# Patient Record
Sex: Female | Born: 1985
Health system: Southern US, Community
[De-identification: ages and names within clinical notes are randomized; demographics above are authoritative.]

## PROBLEM LIST (undated history)

## (undated) DIAGNOSIS — M898X9 Other specified disorders of bone, unspecified site: Secondary | ICD-10-CM

---
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
## 2007-12-02 ENCOUNTER — Emergency Department (HOSPITAL_COMMUNITY): Admission: EM | Admit: 2007-12-02 | Discharge: 2007-12-02 | Payer: Self-pay | Admitting: Emergency Medicine

## 2011-07-16 ENCOUNTER — Ambulatory Visit (INDEPENDENT_AMBULATORY_CARE_PROVIDER_SITE_OTHER): Payer: Self-pay | Admitting: Family Medicine

## 2011-07-16 ENCOUNTER — Encounter: Payer: Self-pay | Admitting: Family Medicine

## 2011-07-16 VITALS — BP 114/72 | HR 62 | Temp 97.8°F | Ht 67.25 in | Wt 230.3 lb

## 2011-07-16 DIAGNOSIS — Z23 Encounter for immunization: Secondary | ICD-10-CM

## 2011-07-16 DIAGNOSIS — R591 Generalized enlarged lymph nodes: Secondary | ICD-10-CM

## 2011-07-16 DIAGNOSIS — R599 Enlarged lymph nodes, unspecified: Secondary | ICD-10-CM

## 2011-07-16 MED ORDER — AMOXICILLIN-POT CLAVULANATE 875-125 MG PO TABS: 1.0000 | ORAL_TABLET | Freq: Two times a day (BID) | ORAL | Status: AC

## 2011-07-16 NOTE — Progress Notes (Signed)
  Subjective:    Patient ID: Shari Small, female    DOB: Jan 12, 1986, 26 y.o.   MRN: [id number]  HPI Pt here c/o lump behind R ear that hurts but has now gotten smaller.   No fever, congestion.   Review of Systems     Objective:   Physical Exam  Constitutional: She appears well-developed and well-nourished. No distress.  HENT:  Head: Normocephalic.  Right Ear: External ear normal.  Left Ear: External ear normal.  Nose: Nose normal.  Mouth/Throat: Oropharyngeal exudate present.  Neck: Normal range of motion. Neck supple.       Very small lymph node R side --tender to touch  Neurological: She is alert.  Skin: She is not diaphoretic.  Psychiatric: She has a normal mood and affect. Her behavior is normal. Judgment and thought content normal.          Assessment & Plan:  Lymph adenopathy-----resolving                                Check cbcd                               Augmentin for 10 days                                F/u if no better ---also should be evaluated by dentist--? TMJ

## 2011-07-16 NOTE — Progress Notes (Signed)
Addended by: Arnette Norris on: 07/16/2011 04:12 PM   Modules accepted: Orders

## 2011-07-16 NOTE — Patient Instructions (Signed)
Lymphadenopathy Lymphadenopathy means "disease of the lymph glands." But the term is usually used to describe swollen or enlarged lymph glands, also called lymph nodes. These are the bean-shaped organs found in many locations including the neck, underarm, and groin. Lymph glands are part of the immune system, which fights infections in your body. Lymphadenopathy can occur in just one area of the body, such as the neck, or it can be generalized, with lymph node enlargement in several areas. The nodes found in the neck are the most common sites of lymphadenopathy. CAUSES  When your immune system responds to germs (such as viruses or bacteria ), infection-fighting cells and fluid build up. This causes the glands to grow in size. This is usually not something to worry about. Sometimes, the glands themselves can become infected and inflamed. This is called lymphadenitis. Enlarged lymph nodes can be caused by many diseases:  Bacterial disease, such as strep throat or a skin infection.   Viral disease, such as a common cold.   Other germs, such as lyme disease, tuberculosis, or sexually transmitted diseases.   Cancers, such as lymphoma (cancer of the lymphatic system) or leukemia (cancer of the white blood cells).   Inflammatory diseases such as lupus or rheumatoid arthritis.   Reactions to medications.  Many of the diseases above are rare, but important. This is why you should see your caregiver if you have lymphadenopathy. SYMPTOMS   Swollen, enlarged lumps in the neck, back of the head or other locations.   Tenderness.   Warmth or redness of the skin over the lymph nodes.   Fever.  DIAGNOSIS  Enlarged lymph nodes are often near the source of infection. They can help healthcare providers diagnose your illness. For instance:   Swollen lymph nodes around the jaw might be caused by an infection in the mouth.   Enlarged glands in the neck often signal a throat infection.   Lymph nodes that  are swollen in more than one area often indicate an illness caused by a virus.  Your caregiver most likely will know what is causing your lymphadenopathy after listening to your history and examining you. Blood tests, x-rays or other tests may be needed. If the cause of the enlarged lymph node cannot be found, and it does not go away by itself, then a biopsy may be needed. Your caregiver will discuss this with you. TREATMENT  Treatment for your enlarged lymph nodes will depend on the cause. Many times the nodes will shrink to normal size by themselves, with no treatment. Antibiotics or other medicines may be needed for infection. Only take over-the-counter or prescription medicines for pain, discomfort or fever as directed by your caregiver. HOME CARE INSTRUCTIONS  Swollen lymph glands usually return to normal when the underlying medical condition goes away. If they persist, contact your health-care provider. He/she might prescribe antibiotics or other treatments, depending on the diagnosis. Take any medications exactly as prescribed. Keep any follow-up appointments made to check on the condition of your enlarged nodes.  SEEK MEDICAL CARE IF:   Swelling lasts for more than two weeks.   You have symptoms such as weight loss, night sweats, fatigue or fever that does not go away.   The lymph nodes are hard, seem fixed to the skin or are growing rapidly.   Skin over the lymph nodes is red and inflamed. This could mean there is an infection.  SEEK IMMEDIATE MEDICAL CARE IF:   Fluid starts leaking from the area of the   enlarged lymph node.   You develop a fever of 102 F (38.9 C) or greater.   Severe pain develops (not necessarily at the site of a large lymph node).   You develop chest pain or shortness of breath.   You develop worsening abdominal pain.  MAKE SURE YOU:   Understand these instructions.   Will watch your condition.   Will get help right away if you are not doing well or get  worse.  Document Released: 03/13/2008 Document Revised: 02/14/2011 Document Reviewed: 03/13/2008 [employer] Patient Information 2012 [employer]. 

## 2011-07-17 LAB — CBC WITH DIFFERENTIAL/PLATELET
Basophils Absolute: 0 10*3/uL (ref 0.0–0.1)
Basophils Relative: 0.5 % (ref 0.0–3.0)
Eosinophils Absolute: 0.5 10*3/uL (ref 0.0–0.7)
Eosinophils Relative: 5 % (ref 0.0–5.0)
HCT: 46.2 % — ABNORMAL HIGH (ref 36.0–46.0)
Hemoglobin: 15.2 g/dL — ABNORMAL HIGH (ref 12.0–15.0)
Lymphocytes Relative: 41.9 % (ref 12.0–46.0)
Lymphs Abs: 4.1 10*3/uL — ABNORMAL HIGH (ref 0.7–4.0)
MCHC: 33 g/dL (ref 30.0–36.0)
MCV: 92.4 fl (ref 78.0–100.0)
Monocytes Absolute: 0.9 10*3/uL (ref 0.1–1.0)
Monocytes Relative: 9.3 % (ref 3.0–12.0)
Neutro Abs: 4.3 10*3/uL (ref 1.4–7.7)
Neutrophils Relative %: 43.3 % (ref 43.0–77.0)
Platelets: 219 10*3/uL (ref 150.0–400.0)
RBC: 4.99 Mil/uL (ref 3.87–5.11)
RDW: 13.8 % (ref 11.5–14.6)
WBC: 9.8 10*3/uL (ref 4.5–10.5)

## 2011-08-03 ENCOUNTER — Ambulatory Visit: Payer: Self-pay | Admitting: Family Medicine

## 2013-02-16 DIAGNOSIS — M898X9 Other specified disorders of bone, unspecified site: Secondary | ICD-10-CM

## 2013-02-16 HISTORY — DX: Other specified disorders of bone, unspecified site: M89.8X9

## 2013-03-05 ENCOUNTER — Encounter: Payer: Self-pay | Admitting: Family Medicine

## 2013-03-05 ENCOUNTER — Ambulatory Visit (INDEPENDENT_AMBULATORY_CARE_PROVIDER_SITE_OTHER): Payer: BC Managed Care – PPO | Admitting: Family Medicine

## 2013-03-05 DIAGNOSIS — M79609 Pain in unspecified limb: Secondary | ICD-10-CM
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
## 2013-03-05 NOTE — Patient Instructions (Signed)
Deep Vein Thrombosis A deep vein thrombosis (DVT) is a blood clot that develops in a deep vein. A DVT is a clot in the deep, larger veins of the leg, arm, or pelvis. These are more dangerous than clots that might form in veins near the surface of the body. A DVT can lead to complications if the clot breaks off and travels in the bloodstream to the lungs.  A DVT can damage the valves in your leg veins, so that instead of flowing upwards, the blood pools in the lower leg. This is called post-thrombotic syndrome, and can result in pain, swelling, discoloration, and sores on the leg. Once identified, a DVT can be treated. It can also be prevented in some circumstances. Once you have had a DVT, you may be at increased risk for a DVT in the future. CAUSES Blood clots form in a vein for different reasons. Usually several things contribute to blood clots. Contributing factors include:  The flow of blood slows down.  The inside of the vein is damaged in some way.  The person has a condition that makes blood clot more easily. Some people are more likely than others to develop blood clots. That is because they have more factors that make clots likely. These are called risk factors. Risk factors include:   Older age, especially over 75 years old.  Having a history of blood clots. This means you have had one before. Or, it means that someone else in your family has had blood clots. You may have a genetic tendency to form clots.  Having major or lengthy surgery. This is especially true for surgery on the hip, knee, or belly (abdomen). Hip surgery is particularly high risk.  Breaking a hip or leg.  Sitting or lying still for a long time. This includes long distance travel, paralysis, or recovery from an illness or surgery.  Cancer, or cancer treatment.  Having a long, thin tube (catheter) placed inside a vein during a medical procedure.  Being overweight (obese).  Pregnancy and childbirth. Hormone  changes make the blood clot more easily during pregnancy. The fetus puts pressure on the veins of the pelvis. There is also risk of injury to veins during delivery or a caesarean. The risk is at its highest just after childbirth.  Medicines with the female hormone estrogen. This includes birth control pills and hormone replacement therapy.  Smoking.  Other circulation or heart problems. SYMPTOMS When a clot forms, it can either partially or totally block the blood flow in that vein. Symptoms of a DVT can include:  Swelling of the leg or arm, especially if one side is much worse.  Warmth and redness of the leg or arm, especially if one side is much worse.  Pain in an arm or leg. If the clot is in the leg, symptoms may be more noticeable or worse when standing or walking. The symptoms of a DVT that has traveled to the lungs (pulmonary embolism, PE) usually start suddenly, and include:  Shortness of breath.  Coughing.  Coughing up blood or blood-tinged phlegm.  Chest pain. The chest pain is often worse with deep breaths.  Rapid heartbeat. Anyone with these symptoms should get emergency medical treatment right away. Call your local emergency services (911 in [place] if you have these symptoms. DIAGNOSIS If a DVT is suspected, your caregiver will take a full medical history and carry out a physical exam. Tests that also may be required include:  Blood tests, including studies of   the clotting properties of the blood.  Ultrasonography to see if you have clots in your legs or lungs.  X-rays to show the flow of blood when dye is injected into the veins (venography).  Studies of your lungs, if you have any chest symptoms. PREVENTION  Exercise the legs regularly. Take a brisk 30 minute walk every day.  Maintain a weight that is appropriate for your height.  Avoid sitting or lying in bed for long periods of time without moving your legs.  Women, particularly those over the age of 35,  should consider the risks and benefits of taking estrogen medicines, including birth control pills.  Do not smoke, especially if you take estrogen medicines.  Long distance travel can increase your risk of DVT. You should exercise your legs by walking or pumping the muscles every hour.  In-hospital prevention:  Many of the risk factors above relate to situations that exist with hospitalization, either for illness, injury, or elective surgery.  Your caregiver will assess you for the need for venous thromboembolism prophylaxis when you are admitted to the hospital. If you are having surgery, your surgeon will assess you the day of or day after surgery.  Prevention may include medical and nonmedical measures. TREATMENT Treatment for DVT helps prevent death and disability. The most common treatment for DVT is blood thinning (anticoagulant) medicine, which reduces the blood's tendency to clot. Anticoagulants can stop new blood clots from forming and old ones from growing. They cannot dissolve existing clots. Your body does this by itself over time. Anticoagulants can be given by mouth, by intravenous (IV) access, or by injection. Your caregiver will determine the best program for you.  Heparin or related medicines (low molecular weight heparin) are usually the first treatment for a blood clot. They act quickly. However, they cannot be taken orally.  Heparin can cause a fall in a component of blood that stops bleeding and forms blood clots (platelets). You will be monitored with blood tests to be sure this does not occur.  Warfarin is an anticoagulant that can be swallowed (taken orally). It takes a few days to start working, so usually heparin or related medicines are used in combination. Once warfarin is working, heparin is usually stopped.  Less commonly, clot dissolving drugs (thrombolytics) are used to dissolve a DVT. They carry a high risk of bleeding, so they are used mainly in severe cases,  where a life or limb is threatened.  Very rarely, a blood clot in the leg needs to be removed surgically.  If you are unable to take anticoagulants, your caregiver may arrange for you to have a filter placed in a main vein in your belly (abdomen). This filter prevents clots from traveling to your lungs. HOME CARE INSTRUCTIONS  Take all medicines prescribed by your caregiver. Follow the directions carefully.  Warfarin. Most people will continue taking warfarin after hospital discharge. Your caregiver will advise you on the length of treatment (usually 3 6 months, sometimes lifelong).  Too much and too little warfarin are both dangerous. Too much warfarin increases the risk of bleeding. Too little warfarin continues to allow the risk for blood clots. While taking warfarin, you will need to have regular blood tests to measure your blood clotting time. These blood tests usually include both the prothrombin time (PT) and international normalized ratio (INR) tests. The PT and INR results allow your caregiver to adjust your dose of warfarin. The dose can change for many reasons. It is critically important that   you take warfarin exactly as prescribed, and that you have your PT and INR levels drawn exactly as directed.  Many foods, especially foods high in vitamin K can interfere with warfarin and affect the PT and INR results. Foods high in vitamin K include spinach, kale, broccoli, cabbage, collard and turnip greens, brussels sprouts, peas, cauliflower, seaweed, and parsley as well as beef and pork liver, green tea, and soybean oil. You should eat a consistent amount of foods high in vitamin K. Avoid major changes in your diet, or notify your caregiver before changing your diet. Arrange a visit with a dietitian to answer your questions.  Many medicines can interfere with warfarin and affect the PT and INR results. You must tell your caregiver about any and all medicines you take, this includes all vitamins  and supplements. Be especially cautious with aspirin and anti-inflammatory medicines. Ask your caregiver before taking these. Do not take or discontinue any prescribed or over-the-counter medicine except on the advice of your caregiver or pharmacist.  Warfarin can have side effects, primarily excessive bruising or bleeding. You will need to hold pressure over cuts for longer than usual. Your caregiver or pharmacist will discuss other potential side effects.  Alcohol can change the body's ability to handle warfarin. It is best to avoid alcoholic drinks or consume only very small amounts while taking warfarin. Notify your caregiver if you change your alcohol intake.  Notify your dentist or other caregivers before procedures.  Activity. Ask your caregiver how soon you can go back to normal activities. It is important to stay active to prevent blood clots. If you are on anticoagulant medicine, avoid contact sports.  Exercise. It is very important to exercise. This is especially important while traveling, sitting or standing for long periods of time. Exercise your legs by walking or by pumping the muscles frequently. Take frequent walks.  Compression stockings. These are tight elastic stockings that apply pressure to the lower legs. This pressure can help keep the blood in the legs from clotting. You may need to wear compressions stockings at home to help prevent a DVT.  Smoking. If you smoke, quit. Ask your caregiver for help with quitting smoking.  Learn as much as you can about DVT. Knowing more about the condition should help you keep it from coming back.  Wear a medical alert bracelet or carry a medical alert card. SEEK MEDICAL CARE IF:  You notice a rapid heartbeat.  You feel weaker or more tired than usual.  You feel faint.  You notice increased bruising.  You feel your symptoms are not getting better in the time expected.  You believe you are having side effects of medicine. SEEK  IMMEDIATE MEDICAL CARE IF:  You have chest pain.  You have trouble breathing.  You have new or increased swelling or pain in one leg.  You cough up blood.  You notice blood in vomit, in a bowel movement, or in urine. MAKE SURE YOU:  Understand these instructions.  Will watch your condition.  Will get help right away if you are not doing well or get worse. Document Released: 06/04/2005 Document Revised: 02/27/2012 Document Reviewed: 07/27/2010 [employer] Patient Information 2014 [employer].  

## 2013-03-09 ENCOUNTER — Telehealth: Payer: Self-pay | Admitting: Family Medicine

## 2013-03-09 ENCOUNTER — Other Ambulatory Visit: Payer: Self-pay | Admitting: Family Medicine

## 2013-03-09 NOTE — Telephone Encounter (Signed)
Patient states that she still has a lot of leg pain after following recommendations for 1 week and wants to know if she can be referred to a specialist. Please advise.

## 2013-03-09 NOTE — Telephone Encounter (Signed)
Please advise      KP 

## 2013-03-09 NOTE — Telephone Encounter (Signed)
Referral put in for sport med

## 2013-03-10 ENCOUNTER — Encounter: Payer: Self-pay | Admitting: Family Medicine

## 2013-03-10 ENCOUNTER — Ambulatory Visit (INDEPENDENT_AMBULATORY_CARE_PROVIDER_SITE_OTHER): Payer: BC Managed Care – PPO | Admitting: Family Medicine

## 2013-03-10 VITALS — BP 119/84 | HR 79 | Ht 67.0 in | Wt 190.0 lb

## 2013-03-10 DIAGNOSIS — M79604 Pain in right leg: Secondary | ICD-10-CM

## 2013-03-10 DIAGNOSIS — M79609 Pain in unspecified limb: Secondary | ICD-10-CM

## 2013-03-11 ENCOUNTER — Ambulatory Visit: Payer: BC Managed Care – PPO | Admitting: Family Medicine

## 2013-03-12 ENCOUNTER — Encounter: Payer: Self-pay | Admitting: Family Medicine

## 2013-03-12 DIAGNOSIS — M79604 Pain in right leg: Secondary | ICD-10-CM | POA: Insufficient documentation
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
## 2013-03-12 NOTE — Assessment & Plan Note (Signed)
concerning for myositis ossificans given history, calcifications on doppler u/s.  We discussed trial of conservative care (PT, nsaids, pain management) vs surgical referral (if mature and these are removed, should help a great deal though possible extensive nature of this could mean a fairly large incision, recovery period).  As she has been doing home exercises, stretches she prefers to go ahead with at least consultation with surgeon.   She brought in a copy of a radiograph of distal femur showing mature calcifications.  I think she's going to need dedicated femur films but will defer this to orthopedic surgeon in referral.

## 2013-03-13 ENCOUNTER — Other Ambulatory Visit: Payer: Self-pay | Admitting: Orthopedic Surgery

## 2013-03-13 DIAGNOSIS — M79651 Pain in right thigh: Secondary | ICD-10-CM

## 2013-03-16 ENCOUNTER — Ambulatory Visit
Admission: RE | Admit: 2013-03-16 | Discharge: 2013-03-16 | Disposition: A | Discharge status: Home / Self Care | Payer: BC Managed Care – PPO | Source: Ambulatory Visit | Attending: Orthopedic Surgery | Admitting: Orthopedic Surgery

## 2013-03-16 DIAGNOSIS — M79651 Pain in right thigh: Secondary | ICD-10-CM

## 2013-03-17 ENCOUNTER — Encounter (HOSPITAL_BASED_OUTPATIENT_CLINIC_OR_DEPARTMENT_OTHER): Payer: Self-pay | Admitting: *Deleted

## 2013-03-17 ENCOUNTER — Other Ambulatory Visit: Payer: Self-pay | Admitting: Orthopedic Surgery
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
## 2013-03-19 ENCOUNTER — Ambulatory Visit (HOSPITAL_BASED_OUTPATIENT_CLINIC_OR_DEPARTMENT_OTHER)
Admission: RE | Admit: 2013-03-19 | Discharge: 2013-03-19 | Disposition: A | Discharge status: Home / Self Care | Payer: BC Managed Care – PPO | Source: Ambulatory Visit | Attending: Orthopedic Surgery | Admitting: Orthopedic Surgery

## 2013-03-19 ENCOUNTER — Encounter (HOSPITAL_BASED_OUTPATIENT_CLINIC_OR_DEPARTMENT_OTHER): Payer: Self-pay | Admitting: Anesthesiology

## 2013-03-19 ENCOUNTER — Ambulatory Visit (HOSPITAL_BASED_OUTPATIENT_CLINIC_OR_DEPARTMENT_OTHER): Payer: BC Managed Care – PPO | Admitting: Anesthesiology

## 2013-03-19 ENCOUNTER — Encounter (HOSPITAL_BASED_OUTPATIENT_CLINIC_OR_DEPARTMENT_OTHER)
Admission: RE | Disposition: A | Discharge status: Home / Self Care | Payer: Self-pay | Source: Ambulatory Visit | Attending: Orthopedic Surgery

## 2013-03-19 DIAGNOSIS — IMO0001 Reserved for inherently not codable concepts without codable children: Secondary | ICD-10-CM | POA: Insufficient documentation

## 2013-03-19 DIAGNOSIS — M6100 Myositis ossificans traumatica, unspecified site: Secondary | ICD-10-CM | POA: Insufficient documentation

## 2013-03-19 DIAGNOSIS — M79604 Pain in right leg: Secondary | ICD-10-CM

## 2013-03-19 HISTORY — DX: Other specified disorders of bone, unspecified site: M89.8X9

## 2013-03-19 HISTORY — PX: INCISION AND DRAINAGE HIP: SHX1801

## 2013-03-19 SURGERY — IRRIGATION AND DEBRIDEMENT HIP
Anesthesia: General | Site: Thigh | Laterality: Right | Wound class: Clean

## 2013-03-19 MED ORDER — ONDANSETRON HCL 4 MG/2ML IJ SOLN
INTRAMUSCULAR | Status: DC | PRN
  Administered 2013-03-19: 4 mg via INTRAVENOUS

## 2013-03-19 MED ORDER — OXYCODONE-ACETAMINOPHEN 7.5-325 MG PO TABS: 1.0000 | ORAL_TABLET | ORAL | Status: DC | PRN

## 2013-03-19 MED ORDER — OXYCODONE HCL 5 MG/5ML PO SOLN: 5.0000 mg | Freq: Once | ORAL | Status: AC | PRN

## 2013-03-19 MED ORDER — DEXTROSE-NACL 5-0.45 % IV SOLN: INTRAVENOUS | Status: DC

## 2013-03-19 MED ORDER — PROMETHAZINE HCL 25 MG/ML IJ SOLN: 6.2500 mg | INTRAMUSCULAR | Status: DC | PRN

## 2013-03-19 MED ORDER — MIDAZOLAM HCL 2 MG/2ML IJ SOLN: 1.0000 mg | INTRAMUSCULAR | Status: DC | PRN

## 2013-03-19 MED ORDER — MIDAZOLAM HCL 2 MG/ML PO SYRP: 12.0000 mg | ORAL_SOLUTION | Freq: Once | ORAL | Status: DC | PRN

## 2013-03-19 MED ORDER — CEFAZOLIN SODIUM-DEXTROSE 2-3 GM-% IV SOLR
2.0000 g | INTRAVENOUS | Status: AC
  Administered 2013-03-19: 2 g via INTRAVENOUS

## 2013-03-19 MED ORDER — LACTATED RINGERS IV SOLN
INTRAVENOUS | Status: DC
  Administered 2013-03-19: 10:00:00 via INTRAVENOUS

## 2013-03-19 MED ORDER — LIDOCAINE HCL (CARDIAC) 20 MG/ML IV SOLN
INTRAVENOUS | Status: DC | PRN
  Administered 2013-03-19: 50 mg via INTRAVENOUS

## 2013-03-19 MED ORDER — OXYCODONE HCL 5 MG PO TABS
5.0000 mg | ORAL_TABLET | Freq: Once | ORAL | Status: AC | PRN
  Administered 2013-03-19: 5 mg via ORAL

## 2013-03-19 MED ORDER — MEPERIDINE HCL 25 MG/ML IJ SOLN: 6.2500 mg | INTRAMUSCULAR | Status: DC | PRN

## 2013-03-19 MED ORDER — ACETAMINOPHEN 500 MG PO TABS
1000.0000 mg | ORAL_TABLET | Freq: Once | ORAL | Status: AC
  Administered 2013-03-19: 1000 mg via ORAL

## 2013-03-19 MED ORDER — FENTANYL CITRATE 0.05 MG/ML IJ SOLN: 50.0000 ug | INTRAMUSCULAR | Status: DC | PRN

## 2013-03-19 MED ORDER — PROPOFOL 10 MG/ML IV BOLUS
INTRAVENOUS | Status: DC | PRN
  Administered 2013-03-19: 300 mg via INTRAVENOUS

## 2013-03-19 MED ORDER — KETOROLAC TROMETHAMINE 30 MG/ML IJ SOLN
INTRAMUSCULAR | Status: DC | PRN
  Administered 2013-03-19: 30 mg via INTRAVENOUS

## 2013-03-19 MED ORDER — HYDROMORPHONE HCL PF 1 MG/ML IJ SOLN: 0.2500 mg | INTRAMUSCULAR | Status: DC | PRN

## 2013-03-19 MED ORDER — MIDAZOLAM HCL 5 MG/5ML IJ SOLN
INTRAMUSCULAR | Status: DC | PRN
  Administered 2013-03-19: 2 mg via INTRAVENOUS

## 2013-03-19 MED ORDER — CHLORHEXIDINE GLUCONATE 4 % EX LIQD: 60.0000 mL | Freq: Once | CUTANEOUS | Status: DC

## 2013-03-19 MED ORDER — MIDAZOLAM HCL 2 MG/2ML IJ SOLN: 0.5000 mg | Freq: Once | INTRAMUSCULAR | Status: DC | PRN

## 2013-03-19 MED ORDER — FENTANYL CITRATE 0.05 MG/ML IJ SOLN
INTRAMUSCULAR | Status: DC | PRN
  Administered 2013-03-19 (×4): 25 ug via INTRAVENOUS
  Administered 2013-03-19: 100 ug via INTRAVENOUS
  Administered 2013-03-19: 50 ug via INTRAVENOUS

## 2013-03-19 MED ORDER — DEXAMETHASONE SODIUM PHOSPHATE 4 MG/ML IJ SOLN
INTRAMUSCULAR | Status: DC | PRN
  Administered 2013-03-19: 10 mg via INTRAVENOUS

## 2013-03-19 MED ORDER — BUPIVACAINE-EPINEPHRINE PF 0.5-1:200000 % IJ SOLN
INTRAMUSCULAR | Status: DC | PRN
  Administered 2013-03-19: 20 mL

## 2013-03-19 SURGICAL SUPPLY — 65 items
APL SKNCLS STERI-STRIP NONHPOA (GAUZE/BANDAGES/DRESSINGS) ×1
BAG DECANTER FOR FLEXI CONT (MISCELLANEOUS) IMPLANT
BANDAGE ELASTIC 6 VELCRO ST LF (GAUZE/BANDAGES/DRESSINGS) ×1 IMPLANT
BENZOIN TINCTURE PRP APPL 2/3 (GAUZE/BANDAGES/DRESSINGS) ×1 IMPLANT
BLADE SURG 10 STRL SS (BLADE) ×1 IMPLANT
BLADE SURG 15 STRL LF DISP TIS (BLADE) ×1 IMPLANT
BLADE SURG 15 STRL SS (BLADE) ×2
CLEANER CAUTERY TIP 5X5 PAD (MISCELLANEOUS) ×1 IMPLANT
CLOTH BEACON ORANGE TIMEOUT ST (SAFETY) ×1 IMPLANT
CUFF TOURNIQUET SINGLE 34IN LL (TOURNIQUET CUFF) IMPLANT
DECANTER SPIKE VIAL GLASS SM (MISCELLANEOUS) IMPLANT
DRAPE EXTREMITY T 121X128X90 (DRAPE) ×1 IMPLANT
DRAPE INCISE IOBAN 66X45 STRL (DRAPES) IMPLANT
DRAPE OEC MINIVIEW 54X84 (DRAPES) ×2 IMPLANT
DRAPE PED LAPAROTOMY (DRAPES) IMPLANT
DRAPE SURG 17X23 STRL (DRAPES) ×1 IMPLANT
DRAPE U-SHAPE 47X51 STRL (DRAPES) ×2 IMPLANT
DRAPE U-SHAPE 76X120 STRL (DRAPES) IMPLANT
DRSG PAD ABDOMINAL 8X10 ST (GAUZE/BANDAGES/DRESSINGS) ×2 IMPLANT
DURAPREP 26ML APPLICATOR (WOUND CARE) ×2 IMPLANT
ELECT REM PT RETURN 9FT ADLT (ELECTROSURGICAL) ×2
ELECTRODE REM PT RTRN 9FT ADLT (ELECTROSURGICAL) ×1 IMPLANT
GAUZE SPONGE 4X4 12PLY STRL LF (GAUZE/BANDAGES/DRESSINGS) ×1 IMPLANT
GAUZE XEROFORM 1X8 LF (GAUZE/BANDAGES/DRESSINGS) ×1 IMPLANT
GLOVE BIO SURGEON STRL SZ8 (GLOVE) ×2 IMPLANT
GLOVE BIOGEL PI IND STRL 7.0 (GLOVE) IMPLANT
GLOVE BIOGEL PI IND STRL 8.5 (GLOVE) ×1 IMPLANT
GLOVE BIOGEL PI INDICATOR 7.0 (GLOVE) ×1
GLOVE BIOGEL PI INDICATOR 8.5 (GLOVE) ×1
GLOVE ECLIPSE 6.5 STRL STRAW (GLOVE) ×1 IMPLANT
GLOVE EXAM NITRILE LRG STRL (GLOVE) ×1 IMPLANT
GLOVE ORTHO TXT STRL SZ7.5 (GLOVE) ×2 IMPLANT
GOWN BRE IMP PREV XXLGXLNG (GOWN DISPOSABLE) ×2 IMPLANT
GOWN PREVENTION PLUS XLARGE (GOWN DISPOSABLE) ×2 IMPLANT
IMMOBILIZER KNEE 24 THIGH 36 (MISCELLANEOUS) ×1 IMPLANT
IMMOBILIZER KNEE 24 UNIV (MISCELLANEOUS)
NS IRRIG 1000ML POUR BTL (IV SOLUTION) ×2 IMPLANT
PACK ARTHROSCOPY DSU (CUSTOM PROCEDURE TRAY) ×2 IMPLANT
PACK BASIN DAY SURGERY FS (CUSTOM PROCEDURE TRAY) ×2 IMPLANT
PAD CLEANER CAUTERY TIP 5X5 (MISCELLANEOUS)
PADDING CAST COTTON 6X4 STRL (CAST SUPPLIES) ×1 IMPLANT
PENCIL BUTTON HOLSTER BLD 10FT (ELECTRODE) ×2 IMPLANT
SLEEVE SCD COMPRESS KNEE MED (MISCELLANEOUS) IMPLANT
SPONGE LAP 18X18 X RAY DECT (DISPOSABLE) ×1 IMPLANT
SPONGE LAP 4X18 X RAY DECT (DISPOSABLE) ×1 IMPLANT
STAPLER VISISTAT 35W (STAPLE) IMPLANT
STRIP CLOSURE SKIN 1/2X4 (GAUZE/BANDAGES/DRESSINGS) ×1 IMPLANT
SUT ETHIBOND 5 LR DA (SUTURE) IMPLANT
SUT FIBERWIRE #2 38 T-5 BLUE (SUTURE)
SUT MNCRL AB 3-0 PS2 18 (SUTURE) ×1 IMPLANT
SUT VIC AB 0 CT1 18XCR BRD 8 (SUTURE) IMPLANT
SUT VIC AB 0 CT1 27 (SUTURE) ×2
SUT VIC AB 0 CT1 27XBRD ANBCTR (SUTURE) IMPLANT
SUT VIC AB 0 CT1 8-18 (SUTURE)
SUT VIC AB 0 SH 27 (SUTURE) IMPLANT
SUT VIC AB 1 CT1 27 (SUTURE)
SUT VIC AB 1 CT1 27XBRD ANBCTR (SUTURE) IMPLANT
SUT VIC AB 2-0 SH 27 (SUTURE) ×2
SUT VIC AB 2-0 SH 27XBRD (SUTURE) ×2 IMPLANT
SUTURE FIBERWR #2 38 T-5 BLUE (SUTURE) IMPLANT
SYR BULB 3OZ (MISCELLANEOUS) ×2 IMPLANT
TAPE HYPAFIX 4 X10 (GAUZE/BANDAGES/DRESSINGS) ×1 IMPLANT
TOWEL OR 17X24 6PK STRL BLUE (TOWEL DISPOSABLE) ×2 IMPLANT
UNDERPAD 30X30 INCONTINENT (UNDERPADS AND DIAPERS) ×1 IMPLANT
YANKAUER SUCT BULB TIP NO VENT (SUCTIONS) ×2 IMPLANT

## 2013-03-19 NOTE — Anesthesia Postprocedure Evaluation (Signed)
  Anesthesia Post-op Note  Patient: Shari Small  Procedure(s) Performed: Procedure(s): RIGHT THIGH EXCISION  HETEROTOPIC OSSIFICATIONS (Right)  Patient Location: PACU  Anesthesia Type:General  Level of Consciousness: awake, alert , oriented and patient cooperative  Airway and Oxygen Therapy: Patient Spontanous Breathing  Post-op Pain: none  Post-op Assessment: Post-op Vital signs reviewed, Patient's Cardiovascular Status Stable, Respiratory Function Stable, Patent Airway, No signs of Nausea or vomiting and Pain level controlled  Post-op Vital Signs: Reviewed and stable  Complications: No apparent anesthesia complications

## 2013-03-19 NOTE — Transfer of Care (Signed)
Immediate Anesthesia Transfer of Care Note  Patient: Shari Small  Procedure(s) Performed: Procedure(s): RIGHT THIGH EXCISION  HETEROTOPIC OSSIFICATIONS (Right)  Patient Location: PACU  Anesthesia Type:General  Level of Consciousness: awake and alert   Airway & Oxygen Therapy: Patient Spontanous Breathing and Patient connected to face mask oxygen  Post-op Assessment: Report given to PACU RN and Post -op Vital signs reviewed and stable  Post vital signs: Reviewed and stable  Complications: No apparent anesthesia complications

## 2013-03-19 NOTE — Anesthesia Procedure Notes (Signed)
Procedure Name: LMA Insertion Performed by: Abigayl Hor W Pre-anesthesia Checklist: Patient identified, Timeout performed, Emergency Drugs available, Suction available and Patient being monitored Patient Re-evaluated:Patient Re-evaluated prior to inductionOxygen Delivery Method: Circle system utilized Preoxygenation: Pre-oxygenation with 100% oxygen Intubation Type: IV induction Ventilation: Mask ventilation without difficulty LMA: LMA inserted LMA Size: 5.0 Number of attempts: 1 Placement Confirmation: breath sounds checked- equal and bilateral and positive ETCO2 Tube secured with: Tape Dental Injury: Teeth and Oropharynx as per pre-operative assessment      

## 2013-03-19 NOTE — Interval H&P Note (Signed)
History and Physical Interval Note:  03/19/2013 7:43 AM  Shari Small  has presented today for surgery, with the diagnosis of RIGHT HETEROTOPIC BONR  The various methods of treatment have been discussed with the patient and family. After consideration of risks, benefits and other options for treatment, the patient has consented to  Procedure(s): RIGHT THIGH EXCISE HETEROTOPIC OSSIFICATION (Right) as a surgical intervention .  The patient's history has been reviewed, patient examined, no change in status, stable for surgery.  I have reviewed the patient's chart and labs.  Questions were answered to the patient's satisfaction.     Katarina Riebe F

## 2013-03-19 NOTE — Anesthesia Preprocedure Evaluation (Addendum)
Anesthesia Evaluation  Patient identified by MRN, date of birth, ID band Patient awake    Reviewed: Allergy & Precautions, H&P , NPO status , Patient's Chart, lab work & pertinent test results  History of Anesthesia Complications Negative for: history of anesthetic complications  Airway Mallampati: I TM Distance: >3 FB Neck ROM: Full    Dental  (+) Teeth Intact and Dental Advisory Given   Pulmonary former smoker (quit 1 month),  breath sounds clear to auscultation  Pulmonary exam normal       Cardiovascular negative cardio ROS  Rhythm:Regular Rate:Normal     Neuro/Psych negative neurological ROS     GI/Hepatic Neg liver ROS, GERD-  Controlled,  Endo/Other  negative endocrine ROS  Renal/GU negative Renal ROS     Musculoskeletal   Abdominal   Peds  Hematology   Anesthesia Other Findings   Reproductive/Obstetrics LMP 02/17/13, declines testing                          Anesthesia Physical Anesthesia Plan  ASA: II  Anesthesia Plan: General   Post-op Pain Management:    Induction: Intravenous  Airway Management Planned: LMA  Additional Equipment:   Intra-op Plan:   Post-operative Plan:   Informed Consent: I have reviewed the patients History and Physical, chart, labs and discussed the procedure including the risks, benefits and alternatives for the proposed anesthesia with the patient or authorized representative who has indicated his/her understanding and acceptance.   Dental advisory given  Plan Discussed with: CRNA and Surgeon  Anesthesia Plan Comments: (Plan routine monitors, GA- LMA OK)        Anesthesia Quick Evaluation

## 2013-03-20 ENCOUNTER — Encounter (HOSPITAL_BASED_OUTPATIENT_CLINIC_OR_DEPARTMENT_OTHER): Payer: Self-pay | Admitting: Orthopedic Surgery

## 2013-03-20 NOTE — Op Note (Signed)
NAME:  Shari Small, Shari Small                  ACCOUNT NO.:  [id number]  MEDICAL RECORD NO.:  [id number]  LOCATION:                               FACILITY:  [facility]  PHYSICIAN:  Siegfried Vieth F. Chermaine Schnyder, M.D. DATE OF BIRTH:  11/18/1985  DATE OF PROCEDURE:  03/19/2013 DATE OF DISCHARGE:  03/19/2013                              OPERATIVE REPORT   PREOPERATIVE DIAGNOSIS:  Posttraumatic heterotopic ossification, right mid medial thigh adjacent to invested in adductor fascia.  Seven discrete loose bodies of heterotopic bone.  POSTOPERATIVE DIAGNOSIS:  Posttraumatic heterotopic ossification, right mid medial thigh adjacent to invested in adductor fascia.  Seven discrete loose bodies of heterotopic bone with 7 large and 4 smaller areas of heterotopic ossification excised.  These were invested in the overlying fascia within the adductor musculature.  PROCEDURE:  Right thigh excision of numerous areas of the heterotopic ossification.  SURGEON:  Keshawn Fiorito F. Azalyn Sliwa, M.D.  ASSISTANT:  Bradley Hicks, PA, present throughout the entire case, necessary for timely completion of procedure.  ANESTHESIA:  General.  ESTIMATED BLOOD LOSS:  Minimal.  SPECIMENS EXCISED:  Areas of the heterotopic ossification.  CULTURES:  None.  COMPLICATION:  None.  DRESSINGS:  Soft compressive.  TOURNIQUET:  Not employed.  DESCRIPTION OF PROCEDURE:  The patient was brought to the operating room, placed on the operating table in the supine position.  After adequate anesthesia had been obtained, the right leg was prepped and draped in the usual sterile fashion.  Fluoroscopic guidance was used throughout.  The areas of heterotopic bone could be somewhat palpated medial thigh about half way between the hip and knee approached through a longitudinal incision over the medial aspect of the thigh.  Skin, subcutaneous tissue, and 1.5 inch of fat excised to get down to the fascia layer.  The areas of heterotopic bone were invested in the  fascia over the adductor.  Largest of which was about 2 cm in diameter.  These all appeared to be discrete pseudo encapsulated and very benign in appearance.  All of the areas of heterotopic ossification were completely excised.  The entire area was explored.  Some of these invested into the fascia and into the adductor.  Once I confirmed complete excision and protected neurovascular structures, I used fluoroscopy to again confirm there were nothing remaining.  The wound was copiously irrigated.  Closed in a multilayered fashion because of deep as well as in the disk space from excising heterotopic bone.  Subcutaneous subcuticular closure.  Margins were injected with Marcaine.  Sterile compressive dressing applied. Anesthesia reversed.  Brought to the recovery room.  Tolerated the surgery well.  No complications.     Mearl Olver F. Fabyan Loughmiller, M.D.   ______________________________ Kenlyn Lose F. Eva Griffo, M.D.    DFM/MEDQ  D:  03/19/2013  T:  03/20/2013  Job:  [id number] 

## 2013-03-20 NOTE — Op Note (Deleted)
NAMELAILANA, Shari Small                  ACCOUNT NO.:  [id number]  MEDICAL RECORD NO.:  [id number]  LOCATION:                               FACILITY:  [facility]  PHYSICIAN:  Loreta Ave, M.D. DATE OF BIRTH:  1986-03-22  DATE OF PROCEDURE:  03/19/2013 DATE OF DISCHARGE:  03/19/2013                              OPERATIVE REPORT   PREOPERATIVE DIAGNOSIS:  Posttraumatic heterotopic ossification, right mid medial thigh adjacent to invested in adductor fascia.  Seven discrete loose bodies of heterotopic bone.  POSTOPERATIVE DIAGNOSIS:  Posttraumatic heterotopic ossification, right mid medial thigh adjacent to invested in adductor fascia.  Seven discrete loose bodies of heterotopic bone with 7 large and 4 smaller areas of heterotopic ossification excised.  These were invested in the overlying fascia within the adductor musculature.  PROCEDURE:  Right thigh excision of numerous areas of the heterotopic ossification.  SURGEON:  Loreta Ave, M.D.  ASSISTANT:  Domingo Cocking, PA, present throughout the entire case, necessary for timely completion of procedure.  ANESTHESIA:  General.  ESTIMATED BLOOD LOSS:  Minimal.  SPECIMENS EXCISED:  Areas of the heterotopic ossification.  CULTURES:  None.  COMPLICATION:  None.  DRESSINGS:  Soft compressive.  TOURNIQUET:  Not employed.  DESCRIPTION OF PROCEDURE:  The patient was brought to the operating room, placed on the operating table in the supine position.  After adequate anesthesia had been obtained, the right leg was prepped and draped in the usual sterile fashion.  Fluoroscopic guidance was used throughout.  The areas of heterotopic bone could be somewhat palpated medial thigh about half way between the hip and knee approached through a longitudinal incision over the medial aspect of the thigh.  Skin, subcutaneous tissue, and 1.5 inch of fat excised to get down to the fascia layer.  The areas of heterotopic bone were invested in the  fascia over the adductor.  Largest of which was about 2 cm in diameter.  These all appeared to be discrete pseudo encapsulated and very benign in appearance.  All of the areas of heterotopic ossification were completely excised.  The entire area was explored.  Some of these invested into the fascia and into the adductor.  Once I confirmed complete excision and protected neurovascular structures, I used fluoroscopy to again confirm there were nothing remaining.  The wound was copiously irrigated.  Closed in a multilayered fashion because of deep as well as in the disk space from excising heterotopic bone.  Subcutaneous subcuticular closure.  Margins were injected with Marcaine.  Sterile compressive dressing applied. Anesthesia reversed.  Brought to the recovery room.  Tolerated the surgery well.  No complications.     Loreta Ave, M.D.   ______________________________ Loreta Ave, M.D.    DFM/MEDQ  D:  03/19/2013  T:  03/20/2013  Job:  [id number]

## 2013-03-26 ENCOUNTER — Encounter: Payer: Self-pay | Admitting: Family Medicine

## 2013-04-05 ENCOUNTER — Emergency Department (HOSPITAL_BASED_OUTPATIENT_CLINIC_OR_DEPARTMENT_OTHER): Payer: BC Managed Care – PPO

## 2013-04-05 ENCOUNTER — Emergency Department (HOSPITAL_BASED_OUTPATIENT_CLINIC_OR_DEPARTMENT_OTHER)
Admission: EM | Admit: 2013-04-05 | Discharge: 2013-04-05 | Disposition: A | Discharge status: Home / Self Care | Payer: BC Managed Care – PPO | Attending: Emergency Medicine | Admitting: Emergency Medicine

## 2013-04-05 ENCOUNTER — Encounter (HOSPITAL_BASED_OUTPATIENT_CLINIC_OR_DEPARTMENT_OTHER): Payer: Self-pay | Admitting: Emergency Medicine

## 2013-04-05 DIAGNOSIS — Z87891 Personal history of nicotine dependence: Secondary | ICD-10-CM | POA: Insufficient documentation

## 2013-04-05 DIAGNOSIS — Z8739 Personal history of other diseases of the musculoskeletal system and connective tissue: Secondary | ICD-10-CM | POA: Insufficient documentation

## 2013-04-05 DIAGNOSIS — Y929 Unspecified place or not applicable: Secondary | ICD-10-CM | POA: Insufficient documentation

## 2013-04-05 DIAGNOSIS — S92302A Fracture of unspecified metatarsal bone(s), left foot, initial encounter for closed fracture: Secondary | ICD-10-CM

## 2013-04-05 DIAGNOSIS — X500XXA Overexertion from strenuous movement or load, initial encounter: Secondary | ICD-10-CM | POA: Insufficient documentation

## 2013-04-05 DIAGNOSIS — Y9389 Activity, other specified: Secondary | ICD-10-CM | POA: Insufficient documentation

## 2013-04-05 DIAGNOSIS — Z79899 Other long term (current) drug therapy: Secondary | ICD-10-CM | POA: Insufficient documentation

## 2013-04-05 DIAGNOSIS — S92309A Fracture of unspecified metatarsal bone(s), unspecified foot, initial encounter for closed fracture: Secondary | ICD-10-CM | POA: Insufficient documentation

## 2013-04-05 NOTE — ED Notes (Signed)
Patient placed in CAM walker and crutches. Patient demonstrates appropriate use of crutches in the department and verbalizes understanding in care of CAM walker. Patient has no additional questions/concerns on departure from ED.

## 2013-04-05 NOTE — ED Provider Notes (Signed)
CSN: [id number]     Arrival date & time 04/05/13  1753 History   First MD Initiated Contact with Patient 04/05/13 1902     Chief Complaint  Patient presents with  . Foot Injury   (Consider location/radiation/quality/duration/timing/severity/associated sxs/prior Treatment) Patient is a 27 y.o. female presenting with foot injury.  Foot Injury  Patient is a 27 yo female with history of heterotopic ossification in right thigh who presents s/p rolling of anklle with left foot pain. Noted she stepped on the edge of a concrete curb and rolled her left ankle. Had pain immediately. Noted could walk on this right after, though now having difficulty bearing weight. Took one percocet prior to coming to the ED and pain is improved with this. Notes pain and swelling on lateral portion of left foot. Denies numbness or tingling in foot. Denies coolness in foot.  Past Medical History  Diagnosis Date  . Heterotopic ossification 02/2013    right thigh   Past Surgical History  Procedure Laterality Date  . Wisdom tooth extraction  2004  . Incision and drainage hip Right 03/19/2013    Procedure: RIGHT THIGH EXCISION  HETEROTOPIC OSSIFICATIONS;  Surgeon: Loreta Ave, MD;  Location: [facility];  Service: Orthopedics;  Laterality: Right;   Family History  Problem Relation Age of Onset  . Heart disease Father     A-Fib  . Heart disease Paternal Grandmother     A-fib with pacemaker  . Diabetes Father   . Colon cancer Paternal Grandfather    History  Substance Use Topics  . Smoking status: Former Smoker -- 8 years    Quit date: 03/03/2013  . Smokeless tobacco: Never Used  . Alcohol Use: No   OB History   Grav Para Term Preterm Abortions TAB SAB Ect Mult Living                 Review of Systems  Musculoskeletal:       With pain over lateral aspect of left foot, swelling in the same area  Neurological: Negative for weakness and numbness.       No change in sensation     Allergies  Tussin  Home Medications   Current Outpatient Rx  Name  Route  Sig  Dispense  Refill  . indomethacin (INDOCIN SR) 75 MG CR capsule   Oral   Take 75 mg by mouth 2 (two) times daily with a meal.         . oxyCODONE-acetaminophen (PERCOCET) 7.5-325 MG per tablet   Oral   Take 1-2 tablets by mouth every 4 (four) hours as needed for pain.   60 tablet   0    BP 130/79  Pulse 75  Temp(Src) 98.9 F (37.2 C) (Oral)  Resp 20  Ht 5\' 7"  (1.702 m)  Wt 190 lb (86.183 kg)  BMI 29.75 kg/m2  SpO2 98%  LMP 02/17/2013 Physical Exam  Constitutional: She appears well-developed and well-nourished. No distress.  HENT:  Head: Normocephalic and atraumatic.  Mouth/Throat: Oropharynx is clear and moist.  Eyes: Pupils are equal, round, and reactive to light.  Cardiovascular: Normal rate, regular rhythm and normal heart sounds.   Pulmonary/Chest: Effort normal and breath sounds normal.  Musculoskeletal: She exhibits no edema.  Left foot with tenderness to palpation over the base of the 5th metatarsal, mild swelling in this area as well, minimal bruising, left foot non-tender elsewhere, is able to move left toes, posterior tib pulse 2+ in left foot, sensation  to light touch intact in left foot, medial and lateral maleoli non-tender to palpation, full ROM at ankle Right foot with out any tenderness or deformity, 2+ posterior tib pulses, sensation to light touch intact, able to move toes.  Neurological: She is alert.  Skin: Skin is warm and dry.    ED Course  Procedures (including critical care time) Labs Review Labs Reviewed - No data to display Imaging Review Dg Foot Complete Left  04/05/2013   CLINICAL DATA:  Injury  EXAM: LEFT FOOT - COMPLETE 3+ VIEW  COMPARISON:  None.  FINDINGS: Fracture the base of the 5th metatarsal. No other fracture is identified. Calcaneal spurring is noted. No significant degenerative change elsewhere  IMPRESSION: Nondisplaced fracture base of 5th  metatarsal.   Electronically Signed   By: Marlan Palau M.D.   On: 04/05/2013 19:07    EKG Interpretation   None       MDM   1. Fracture of 5th metatarsal, left, closed, initial encounter    Patient seen and examined. Patient with rolling of ankle earlier this evening. Immediate pain. Found to have non-displaced base of 5th metatarsal fracture of left foot. Patient took percocet prior to arriving to the ED and states pain is well controlled at time of evaluation. Denied pain medication in the ED. Discussed findings with patient. Attempted to place patient in a posterior short leg splint, but splint molded in to plantarflexion so was placed in CAM walker instead to ensure adequate positioning foot. She was placed in a CAM walker and given crutches for non-weight bearing. She was neurovascularly intact following placement of CAM walker. She is to follow-up with ortho, Dr Eulah Pont in the next 1-2 weeks. Is to use percocet that she has at home for pain management. Return precautions given.  This patient was discussed and seen with my attending Dr Ethelda Chick.  Marikay Alar, MD [facility] PGY-2 04/05/13 10:03 pm    Glori Luis, MD 04/05/13 [phone]

## 2013-04-05 NOTE — ED Provider Notes (Signed)
I have personally seen and examined the patient.  I have discussed the plan of care with the resident.  I have reviewed the documentation on PMH/FH/Soc. History.  I have reviewed the documentation of the resident and agree.  Doug Sou, MD 04/05/13 2329

## 2013-04-05 NOTE — ED Notes (Signed)
States left foot rolled off a concrete ramp, c/o left foot pain now.
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
## 2013-04-05 NOTE — ED Notes (Signed)
Will obtain x rays of left foot d/t swelling noted laterally.

## 2014-03-19 ENCOUNTER — Encounter: Payer: Self-pay | Admitting: Internal Medicine

## 2014-03-19 ENCOUNTER — Ambulatory Visit (INDEPENDENT_AMBULATORY_CARE_PROVIDER_SITE_OTHER): Payer: BC Managed Care – PPO | Admitting: Internal Medicine

## 2014-03-19 VITALS — BP 125/84 | HR 58 | Temp 97.7°F | Wt 212.0 lb

## 2014-03-19 DIAGNOSIS — H9201 Otalgia, right ear: Secondary | ICD-10-CM

## 2014-03-19 NOTE — Progress Notes (Signed)
   Subjective:    Patient ID: Shari Small, female    DOB: 24-Oct-1985, 28 y.o.   MRN: [id number]  DOS:  03/19/2014 Type of visit - description : acute Interval history: Woke up  with left ear pain, later on  she developed bilateral ear pain,   is on and off. Denies other symptoms, no ear discharge or bleeding.    ROS Denies fever or chills No RN or  sore throat No sinus pain or congestion She got dizzy a couple of times when she stood up quickly this morning. Incidentally, last menstrual period was 6 weeks ago, offered a UPT, declined,  she does  not taken birth control but she is not sexually active either   Past Medical History  Diagnosis Date  . Heterotopic ossification 02/2013    right thigh    Past Surgical History  Procedure Laterality Date  . Wisdom tooth extraction  2004  . Incision and drainage hip Right 03/19/2013    Procedure: RIGHT THIGH EXCISION  HETEROTOPIC OSSIFICATIONS;  Surgeon: Loreta Aveaniel F Murphy, MD;  Location: [facility];  Service: Orthopedics;  Laterality: Right;    History   Social History  . Marital Status: Single    Spouse Name: N/A    Number of Children: o  . Years of Education: N/A   Occupational History  .     Social History Main Topics  . Smoking status: Former Smoker -- 8 years    Quit date: 03/03/2013  . Smokeless tobacco: Never Used  . Alcohol Use: Yes  . Drug Use: No  . Sexual Activity: Not Currently   Other Topics Concern  . Not on file   Social History Narrative   Regular exercise: 3-4 x a week; treadmill and pilates   Caffeine use: occasionally        Medication List    Notice As of 03/19/2014 11:59 PM   You have not been prescribed any medications.         Objective:   Physical Exam BP 125/84  Pulse 58  Temp(Src) 97.7 F (36.5 C) (Oral)  Wt 212 lb (96.163 kg)  SpO2 100%  LMP 02/05/2014 General -- alert, well-developed, NAD.    HEENT-- Not pale.  R and L  Ear-- TMs are bulge, and no rectal  discharge Throat symmetric, no redness or discharge. Face symmetric, sinuses not tender to palpation. Nose slt  Congested ("Always congested") Lungs -- normal respiratory effort, no intercostal retractions, no accessory muscle use, and normal breath sounds.  Heart-- normal rate, regular rhythm, no murmur.  Neurologic--  alert & oriented X3. Speech normal, gait appropriate for age, strength symmetric and appropriate for age.  Psych-- Cognition and judgment appear intact. Cooperative with normal attention span and concentration. No anxious or depressed appearing.       Assessment & Plan:    Otalgia without evidence of any sinus infections or bronchitis. Serous otitis?. Plan: Nasacort, decongestants, call if not improving, see instructions

## 2014-03-19 NOTE — Patient Instructions (Signed)
tylenol  For congestion use OTC Nasocort: 2 nasal sprays on each side of the nose daily until you feel better  Get pseudoephedrine 30 mg (behind the counter, you need to talk with the pharmacist) take one tablet 3 or 4 times a day as needed for congestion  Call if not gradually better over the next 3-4 days

## 2014-03-19 NOTE — Progress Notes (Signed)
Pre visit review using our clinic review tool, if applicable. No additional management support is needed unless otherwise documented below in the visit note. 

## 2015-05-09 ENCOUNTER — Encounter (HOSPITAL_BASED_OUTPATIENT_CLINIC_OR_DEPARTMENT_OTHER): Payer: Self-pay | Admitting: Emergency Medicine

## 2015-05-09 ENCOUNTER — Emergency Department (HOSPITAL_BASED_OUTPATIENT_CLINIC_OR_DEPARTMENT_OTHER)
Admission: EM | Admit: 2015-05-09 | Discharge: 2015-05-09 | Disposition: A | Discharge status: Home / Self Care | Payer: Self-pay | Attending: Emergency Medicine | Admitting: Emergency Medicine

## 2015-05-09 ENCOUNTER — Emergency Department (HOSPITAL_BASED_OUTPATIENT_CLINIC_OR_DEPARTMENT_OTHER): Payer: Self-pay

## 2015-05-09 DIAGNOSIS — Y998 Other external cause status: Secondary | ICD-10-CM | POA: Insufficient documentation

## 2015-05-09 DIAGNOSIS — S99911A Unspecified injury of right ankle, initial encounter: Secondary | ICD-10-CM | POA: Insufficient documentation

## 2015-05-09 DIAGNOSIS — S92301A Fracture of unspecified metatarsal bone(s), right foot, initial encounter for closed fracture: Secondary | ICD-10-CM

## 2015-05-09 DIAGNOSIS — W010XXA Fall on same level from slipping, tripping and stumbling without subsequent striking against object, initial encounter: Secondary | ICD-10-CM | POA: Insufficient documentation

## 2015-05-09 DIAGNOSIS — Z87891 Personal history of nicotine dependence: Secondary | ICD-10-CM | POA: Insufficient documentation

## 2015-05-09 DIAGNOSIS — Z8739 Personal history of other diseases of the musculoskeletal system and connective tissue: Secondary | ICD-10-CM | POA: Insufficient documentation

## 2015-05-09 DIAGNOSIS — Y9389 Activity, other specified: Secondary | ICD-10-CM | POA: Insufficient documentation

## 2015-05-09 DIAGNOSIS — S92321A Displaced fracture of second metatarsal bone, right foot, initial encounter for closed fracture: Secondary | ICD-10-CM | POA: Insufficient documentation

## 2015-05-09 DIAGNOSIS — Y92009 Unspecified place in unspecified non-institutional (private) residence as the place of occurrence of the external cause: Secondary | ICD-10-CM | POA: Insufficient documentation

## 2015-05-09 MED ORDER — HYDROCODONE-ACETAMINOPHEN 5-325 MG PO TABS
1.0000 | ORAL_TABLET | Freq: Once | ORAL | Status: AC
  Administered 2015-05-09: 1 via ORAL
  Filled 2015-05-09: qty 1

## 2015-05-09 MED ORDER — HYDROCODONE-ACETAMINOPHEN 5-325 MG PO TABS: 1.0000 | ORAL_TABLET | Freq: Four times a day (QID) | ORAL | Status: DC | PRN

## 2015-05-09 NOTE — ED Provider Notes (Signed)
CSN: [id number]     Arrival date & time 05/09/15  0645 History   First MD Initiated Contact with Patient 05/09/15 [phone]     Chief Complaint  Patient presents with  . Foot Injury     (Consider location/radiation/quality/duration/timing/severity/associated sxs/prior Treatment) HPI 29 year old female who presents with right foot and ankle pain. She is otherwise healthy. Reports letting her dog out of her home yesterday at about 9 PM, when her dog spotted rapid and pulled on the leash. This caused her to become unstable, and reports that her right ankle was very wobbly and sats subsequently twisted inward. She heard a pop. She has been having difficulty bearing weight on her injury since then, but has not had a significant amount of swelling. Localizes pain to the top of her right foot and in the inner ankle. She did take a Percocet yesterday evening and early this morning for pain control. Denies having any other injuries.   Past Medical History  Diagnosis Date  . Heterotopic ossification 02/2013    right thigh   Past Surgical History  Procedure Laterality Date  . Wisdom tooth extraction  2004  . Incision and drainage hip Right 03/19/2013    Procedure: RIGHT THIGH EXCISION  HETEROTOPIC OSSIFICATIONS;  Surgeon: Loreta Ave, MD;  Location: [facility];  Service: Orthopedics;  Laterality: Right;   Family History  Problem Relation Age of Onset  . Heart disease Father     A-Fib  . Heart disease Paternal Grandmother     A-fib with pacemaker  . Diabetes Father   . Colon cancer Paternal Grandfather    Social History  Substance Use Topics  . Smoking status: Former Smoker -- 8 years    Quit date: 03/03/2013  . Smokeless tobacco: Never Used  . Alcohol Use: Yes   OB History    No data available     Review of Systems Physical Exam  Nursing note and vitals reviewed. Constitutional: Well developed, well nourished, non-toxic, and in no acute distress Head: Normocephalic  and atraumatic.  Mouth/Throat: Oropharynx is clear and moist.  Neck: Normal range of motion. Neck supple.  Cardiovascular: Normal rate and regular rhythm.   Pulmonary/Chest: Effort normal and breath sounds normal.  Abdominal: Soft. There is no tenderness. There is no rebound and no guarding.  Musculoskeletal: Normal range of motion.  Neurological: Alert, no facial droop, fluent speech, moves all extremities symmetrically Skin: Skin is warm and dry.  Psychiatric: Cooperative    Allergies  Tussin  Home Medications   Prior to Admission medications   Medication Sig Start Date End Date Taking? Authorizing Provider  HYDROcodone-acetaminophen (NORCO/VICODIN) 5-325 MG tablet Take 1-2 tablets by mouth every 6 (six) hours as needed for moderate pain or severe pain. 05/09/15   Lavera Guise, MD   BP 126/86 mmHg  Pulse 71  Temp(Src) 98.3 F (36.8 C) (Oral)  Resp 18  Ht  (1.676 m)  Wt 190 lb (86.183 kg)  BMI 30.68 kg/m2  SpO2 98%  LMP 04/11/2015 Physical Exam Physical Exam  Nursing note and vitals reviewed. Constitutional: Well developed, well nourished, non-toxic, and in no acute distress Head: Normocephalic and atraumatic.  Mouth/Throat: Oropharynx is clear and moist.  Neck: Neck supple.  Cardiovascular: Normal rate and regular rhythm.  +2 DP pulses bilaterally. Normal capillary refill. Pulmonary/Chest: Effort normal  Abdominal: Soft. No tenderness. Musculoskeletal: Tenderness with palpation over the base of the 2nd metatarsal.  Neurological: Alert, no facial droop, fluent speech,  sensation  in tact over common/deep/superficial peroneal, sural, saphenous nerves Skin: Skin is warm and dry.  Psychiatric: Cooperative  ED Course  Procedures (including critical care time) Labs Review Labs Reviewed - No data to display  Imaging Review Dg Ankle Complete Right  05/09/2015  CLINICAL DATA:  29 year old female with injury to the right ankle from a fall while walking her dog this  morning. EXAM: RIGHT ANKLE - COMPLETE 3+ VIEW COMPARISON:  No priors. FINDINGS: No acute fracture, subluxation or dislocation associated the ankle. Limited visualization of the foot demonstrates what appears to be a nondisplaced comminuted fracture through the base of the second metatarsal, which may be intra-articular. IMPRESSION: 1. No acute radiographic abnormality of the right ankle. 2. Nondisplaced comminuted fracture through the base of the second metatarsal, better demonstrated on accompanying foot radiograph. Electronically Signed   By: Trudie Reedaniel  Entrikin M.D.   On: 05/09/2015 07:48   Dg Foot Complete Right  05/09/2015  CLINICAL DATA:  Recent fall and complains of medial right ankle pain. Pain radiates into the foot. EXAM: RIGHT FOOT COMPLETE - 3+ VIEW COMPARISON:  Right ankle 05/09/2015 FINDINGS: There is a mildly displaced fracture at the base of the second metatarsal bone. Fracture likely involves the tarsometatarsal articulating surface. Fracture appears to be mildly complex with a mildly displaced fragment and another longitudinally oriented fracture line. The other metatarsal bones appear to be intact. Alignment at the tarsometatarsal joint is grossly normal. No significant soft tissue swelling. IMPRESSION: Mildly displaced fracture at the base of the second metatarsal bone. Recommend clinical correlation with regards to a LisFranc injury. Electronically Signed   By: Richarda OverlieAdam  Henn M.D.   On: 05/09/2015 07:55   I have personally reviewed and evaluated these images and lab results as part of my medical decision-making.   MDM   Final diagnoses:  Fracture of 2nd metatarsal, right, closed, initial encounter    29 year old female who presents with right medial foot pain after fall. Neurovascularly in tact extremity with tenderness to palpation at base of the 2nd metatarsal. XR shows mildly displaced fracture at base of the 2nd MTP. No significant laxity noted with stress to suggest severe lisfranc  injury although location is concerning for lisfranc joint involvement. Discussed with Dr. Ranell PatrickNorris from orthopedic surgery who recommend outpatient follow-up for this. Posterior short leg splint applied. Strict return and follow-up instructions reviewed. She expressed understanding of all discharge instructions and felt comfortable with the plan of care.     Lavera Guiseana Duo Leith Hedlund, MD 05/09/15 [phone]

## 2015-05-09 NOTE — ED Notes (Signed)
MD at bedside. 

## 2015-05-09 NOTE — Discharge Instructions (Signed)
Please ambulate with crutches and do not bear weight on your right foot. Please call orthopedic surgery for follow-up within 1 week. Keep leg elevated while at rest. Return without fail for worsening symptoms including worsening pain, vomiting unable to keep down food or fluids, discoloration to the skin of your toes, numbness or weakness of your foot, or any other symptoms concerning to you  Cast or Splint Care Casts and splints support injured limbs and keep bones from moving while they heal. It is important to care for your cast or splint at home.  HOME CARE INSTRUCTIONS  Keep the cast or splint uncovered during the drying period. It can take 24 to 48 hours to dry if it is made of plaster. A fiberglass cast will dry in less than 1 hour.  Do not rest the cast on anything harder than a pillow for the first 24 hours.  Do not put weight on your injured limb or apply pressure to the cast until your health care provider gives you permission.  Keep the cast or splint dry. Wet casts or splints can lose their shape and may not support the limb as well. A wet cast that has lost its shape can also create harmful pressure on your skin when it dries. Also, wet skin can become infected.  Cover the cast or splint with a plastic bag when bathing or when out in the rain or snow. If the cast is on the trunk of the body, take sponge baths until the cast is removed.  If your cast does become wet, dry it with a towel or a blow dryer on the cool setting only.  Keep your cast or splint clean. Soiled casts may be wiped with a moistened cloth.  Do not place any hard or soft foreign objects under your cast or splint, such as cotton, toilet paper, lotion, or powder.  Do not try to scratch the skin under the cast with any object. The object could get stuck inside the cast. Also, scratching could lead to an infection. If itching is a problem, use a blow dryer on a cool setting to relieve discomfort.  Do not trim or  cut your cast or remove padding from inside of it.  Exercise all joints next to the injury that are not immobilized by the cast or splint. For example, if you have a long leg cast, exercise the hip joint and toes. If you have an arm cast or splint, exercise the shoulder, elbow, thumb, and fingers.  Elevate your injured arm or leg on 1 or 2 pillows for the first 1 to 3 days to decrease swelling and pain.It is best if you can comfortably elevate your cast so it is higher than your heart. SEEK MEDICAL CARE IF:   Your cast or splint cracks.  Your cast or splint is too tight or too loose.  You have unbearable itching inside the cast.  Your cast becomes wet or develops a soft spot or area.  You have a bad smell coming from inside your cast.  You get an object stuck under your cast.  Your skin around the cast becomes red or raw.  You have new pain or worsening pain after the cast has been applied. SEEK IMMEDIATE MEDICAL CARE IF:   You have fluid leaking through the cast.  You are unable to move your fingers or toes.  You have discolored (blue or white), cool, painful, or very swollen fingers or toes beyond the cast.  You  have tingling or numbness around the injured area.  You have severe pain or pressure under the cast.  You have any difficulty with your breathing or have shortness of breath.  You have chest pain.   This information is not intended to replace advice given to you by your health care provider. Make sure you discuss any questions you have with your health care provider.   Document Released: 06/01/2000 Document Revised: 03/25/2013 Document Reviewed: 12/11/2012 Elsevier Interactive Patient Education 2016 [employer]  Metatarsal Fracture A metatarsal fracture is a break in a metatarsal bone. Metatarsal bones connect your toe bones to your ankle bones. CAUSES This type of fracture may be caused by:  A sudden twisting of your foot.  A fall onto your  foot.  Overuse or repetitive exercise. RISK FACTORS This condition is more likely to develop in people who:  Play contact sports.  Have a bone disease.  Have a low calcium level. SYMPTOMS Symptoms of this condition include:  Pain that is worse when walking or standing.  Pain when pressing on the foot or moving the toes.  Swelling.  Bruising on the top or bottom of the foot.  A foot that appears shorter than the other one. DIAGNOSIS This condition is diagnosed with a physical exam. You may also have imaging tests, such as:  X-rays.  A CT scan.  MRI. TREATMENT Treatment for this condition depends on its severity and whether a bone has moved out of place. Treatment may involve:  Rest.  Wearing foot support such as a cast, splint, or boot for several weeks.  Using crutches.  Surgery to move bones back into the right position. Surgery is usually needed if there are many pieces of broken bone or bones that are very out of place (displaced fracture).  Physical therapy. This may be needed to help you regain full movement and strength in your foot. You will need to return to your health care provider to have X-rays taken until your bones heal. Your health care provider will look at the X-rays to make sure that your foot is healing well. HOME CARE INSTRUCTIONS  If You Have a Cast:  Do not stick anything inside the cast to scratch your skin. Doing that increases your risk of infection.  Check the skin around the cast every day. Report any concerns to your health care provider. You may put lotion on dry skin around the edges of the cast. Do not apply lotion to the skin underneath the cast.  Keep the cast clean and dry. If You Have a Splint or a Supportive Boot:  Wear it as directed by your health care provider. Remove it only as directed by your health care provider.  Loosen it if your toes become numb and tingle, or if they turn cold and blue.  Keep it clean and  dry. Bathing  Do not take baths, swim, or use a hot tub until your health care provider approves. Ask your health care provider if you can take showers. You may only be allowed to take sponge baths for bathing.  If your health care provider approves bathing and showering, cover the cast or splint with a watertight plastic bag to protect it from water. Do not let the cast or splint get wet. Managing Pain, Stiffness, and Swelling  If directed, apply ice to the injured area (if you have a splint, not a cast).  Put ice in a plastic bag.  Place a towel between your skin and  the bag.  Leave the ice on for 20 minutes, 2-3 times per day.  Move your toes often to avoid stiffness and to lessen swelling.  Raise (elevate) the injured area above the level of your heart while you are sitting or lying down. Driving  Do not drive or operate heavy machinery while taking pain medicine.  Do not drive while wearing foot support on a foot that you use for driving. Activity  Return to your normal activities as directed by your health care provider. Ask your health care provider what activities are safe for you.  Perform exercises as directed by your health care provider or physical therapist. Safety  Do not use the injured foot to support your body weight until your health care provider says that you can. Use crutches as directed by your health care provider. General Instructions  Do not put pressure on any part of the cast or splint until it is fully hardened. This may take several hours.  Do not use any tobacco products, including cigarettes, chewing tobacco, or e-cigarettes. Tobacco can delay bone healing. If you need help quitting, ask your health care provider.  Take medicines only as directed by your health care provider.  Keep all follow-up visits as directed by your health care provider. This is important. SEEK MEDICAL CARE IF:  You have a fever.  Your cast, splint, or boot is too  loose or too tight.  Your cast, splint, or boot is damaged.  Your pain medicine is not helping.  You have pain, tingling, or numbness in your foot that is not going away. SEEK IMMEDIATE MEDICAL CARE IF:  You have severe pain.  You have tingling or numbness in your foot that is getting worse.  Your foot feels cold or becomes numb.  Your foot changes color.   This information is not intended to replace advice given to you by your health care provider. Make sure you discuss any questions you have with your health care provider.   Document Released: 02/24/2002 Document Revised: 10/19/2014 Document Reviewed: 03/31/2014 Elsevier Interactive Patient Education [employer]2016 [employer] Inc[employer]

## 2015-05-09 NOTE — ED Notes (Signed)
Patient to xray dept

## 2018-01-29 ENCOUNTER — Encounter (HOSPITAL_BASED_OUTPATIENT_CLINIC_OR_DEPARTMENT_OTHER): Payer: Self-pay

## 2018-01-29 ENCOUNTER — Emergency Department (HOSPITAL_BASED_OUTPATIENT_CLINIC_OR_DEPARTMENT_OTHER)
Admission: EM | Admit: 2018-01-29 | Discharge: 2018-01-29 | Disposition: A | Discharge status: Home / Self Care | Payer: BLUE CROSS/BLUE SHIELD | Attending: Emergency Medicine | Admitting: Emergency Medicine

## 2018-01-29 ENCOUNTER — Emergency Department (HOSPITAL_BASED_OUTPATIENT_CLINIC_OR_DEPARTMENT_OTHER): Payer: BLUE CROSS/BLUE SHIELD

## 2018-01-29 ENCOUNTER — Other Ambulatory Visit: Payer: Self-pay

## 2018-01-29 DIAGNOSIS — R221 Localized swelling, mass and lump, neck: Secondary | ICD-10-CM | POA: Diagnosis not present

## 2018-01-29 DIAGNOSIS — K118 Other diseases of salivary glands: Secondary | ICD-10-CM

## 2018-01-29 DIAGNOSIS — Z87891 Personal history of nicotine dependence: Secondary | ICD-10-CM | POA: Insufficient documentation

## 2018-01-29 LAB — CBC WITH DIFFERENTIAL/PLATELET
BASOS ABS: 0 10*3/uL (ref 0.0–0.1)
Basophils Relative: 0 %
Eosinophils Absolute: 0.1 10*3/uL (ref 0.0–0.7)
Eosinophils Relative: 1 %
HCT: 47.3 % — ABNORMAL HIGH (ref 36.0–46.0)
HEMOGLOBIN: 16.3 g/dL — AB (ref 12.0–15.0)
LYMPHS ABS: 2.4 10*3/uL (ref 0.7–4.0)
LYMPHS PCT: 18 %
MCH: 30.5 pg (ref 26.0–34.0)
MCHC: 34.5 g/dL (ref 30.0–36.0)
MCV: 88.6 fL (ref 78.0–100.0)
Monocytes Absolute: 0.9 10*3/uL (ref 0.1–1.0)
Monocytes Relative: 7 %
NEUTROS ABS: 9.7 10*3/uL — AB (ref 1.7–7.7)
NEUTROS PCT: 74 %
Platelets: 236 10*3/uL (ref 150–400)
RBC: 5.34 MIL/uL — AB (ref 3.87–5.11)
RDW: 14.1 % (ref 11.5–15.5)
WBC: 13.1 10*3/uL — AB (ref 4.0–10.5)

## 2018-01-29 LAB — BASIC METABOLIC PANEL
Anion gap: 9 (ref 5–15)
BUN: 11 mg/dL (ref 6–20)
CHLORIDE: 103 mmol/L (ref 98–111)
CO2: 23 mmol/L (ref 22–32)
Calcium: 9.2 mg/dL (ref 8.9–10.3)
Creatinine, Ser: 0.85 mg/dL (ref 0.44–1.00)
GFR calc non Af Amer: >60 mL/min (ref >60)
Glucose, Bld: 131 mg/dL — ABNORMAL HIGH (ref 70–99)
POTASSIUM: 3.4 mmol/L — AB (ref 3.5–5.1)
SODIUM: 135 mmol/L (ref 135–145)

## 2018-01-29 MED ORDER — IOPAMIDOL (ISOVUE-300) INJECTION 61%
100.0000 mL | Freq: Once | INTRAVENOUS | Status: AC | PRN
  Administered 2018-01-29: 75 mL via INTRAVENOUS

## 2018-01-29 MED ORDER — AMOXICILLIN-POT CLAVULANATE 875-125 MG PO TABS: 1.0000 | ORAL_TABLET | Freq: Two times a day (BID) | ORAL | 0 refills | Status: DC

## 2018-01-29 NOTE — ED Provider Notes (Signed)
[facility] EMERGENCY DEPARTMENT Provider Note   CSN: [id number] Arrival date & time: 01/29/18  1741     History   Chief Complaint Chief Complaint  Patient presents with  . Facial Swelling    HPI Shari Small is a 32 y.o. female.  She was here with a painful swelling of the right side of her neck that began today.  She states she thought there was a small knot there last week but it went away and then today she had an acute pop sensation and felt the pain and swelling.  It is mostly at the angle of the jaw but it spreading over to in front of her ear.  It is not associated with any dental pain.  She feels she is having a little more discomfort than normal with swallowing but no trouble breathing.  There is no history of any trauma.  She does not recall any kind of sting or other wound.  No fevers no chills no cough no sore throat no headache no blurry vision or double vision.  No numbness or weakness.  The history is provided by the patient.  Neck Injury  This is a new problem. The current episode started 3 to 5 hours ago. The problem occurs constantly. The problem has been gradually worsening. Pertinent negatives include no chest pain, no abdominal pain, no headaches and no shortness of breath. Nothing aggravates the symptoms. Nothing relieves the symptoms. She has tried nothing for the symptoms. The treatment provided no relief.    Past Medical History:  Diagnosis Date  . Heterotopic ossification 02/2013   right thigh    Patient Active Problem List   Diagnosis Date Noted  . Right leg pain 03/12/2013    Past Surgical History:  Procedure Laterality Date  . INCISION AND DRAINAGE HIP Right 03/19/2013   Procedure: RIGHT THIGH EXCISION  HETEROTOPIC OSSIFICATIONS;  Surgeon: Loreta Ave, MD;  Location: [facility];  Service: Orthopedics;  Laterality: Right;  . WISDOM TOOTH EXTRACTION  2004     OB History   None      Home Medications    Prior to  Admission medications   Medication Sig Start Date End Date Taking? Authorizing Provider  HYDROcodone-acetaminophen (NORCO/VICODIN) 5-325 MG tablet Take 1-2 tablets by mouth every 6 (six) hours as needed for moderate pain or severe pain. 05/09/15   Lavera Guise, MD    Family History Family History  Problem Relation Age of Onset  . Heart disease Father        A-Fib  . Diabetes Father   . Heart disease Paternal Grandmother        A-fib with pacemaker  . Colon cancer Paternal Grandfather     Social History Social History   Tobacco Use  . Smoking status: Former Smoker    Years: 8.00    Last attempt to quit: 03/03/2013    Years since quitting: 4.9  . Smokeless tobacco: Never Used  Substance Use Topics  . Alcohol use: Not Currently  . Drug use: No     Allergies   Tussin [guaifenesin]   Review of Systems Review of Systems  Constitutional: Negative for chills and fever.  HENT: Positive for facial swelling and trouble swallowing. Negative for congestion, dental problem, ear discharge, nosebleeds, rhinorrhea, sinus pressure, sinus pain, sneezing and sore throat.   Eyes: Negative for pain, redness and visual disturbance.  Respiratory: Negative for cough and shortness of breath.   Cardiovascular: Negative for  chest pain.  Gastrointestinal: Negative for abdominal pain.  Genitourinary: Negative for dysuria.  Musculoskeletal: Positive for neck pain. Negative for back pain.  Skin: Negative for rash and wound.  Neurological: Negative for headaches.     Physical Exam Updated Vital Signs BP (!) 149/104 (BP Location: Left Arm)   Pulse 94   Temp 98.4 F (36.9 C) (Oral)   Resp 18   Ht 5\' 7"  (1.702 m)   Wt 95.3 kg   LMP 01/16/2018 (Exact Date)   SpO2 100%   BMI 32.89 kg/m   Physical Exam  Constitutional: She is oriented to person, place, and time. She appears well-developed and well-nourished. No distress.  HENT:  Head: Normocephalic and atraumatic.  Eyes: Conjunctivae are  normal.  Neck: No tracheal deviation present.  She is got some tenderness just below the angle of the mandible on the right.  There is no pulsatile mass or thrill.  There is no bruit.  Her TMs are clear bilaterally.  Oropharynx is clear.  Voice is normal with no stridor.  Trachea midline nontender.  No trismus.  Cardiovascular: Normal rate and regular rhythm.  No murmur heard. Pulmonary/Chest: Effort normal and breath sounds normal. No respiratory distress.  Abdominal: Soft. There is no tenderness.  Musculoskeletal: Normal range of motion. She exhibits no edema or deformity.  Neurological: She is alert and oriented to person, place, and time. She has normal strength. No cranial nerve deficit or sensory deficit. Gait normal. GCS eye subscore is 4. GCS verbal subscore is 5. GCS motor subscore is 6.  Skin: Skin is warm and dry. Capillary refill takes less than 2 seconds.  Psychiatric: She has a normal mood and affect.  Nursing note and vitals reviewed.    ED Treatments / Results  Labs (all labs ordered are listed, but only abnormal results are displayed) Labs Reviewed  BASIC METABOLIC PANEL - Abnormal; Notable for the following components:      Result Value   Potassium 3.4 (*)    Glucose, Bld 131 (*)    All other components within normal limits  CBC WITH DIFFERENTIAL/PLATELET - Abnormal; Notable for the following components:   WBC 13.1 (*)    RBC 5.34 (*)    Hemoglobin 16.3 (*)    HCT 47.3 (*)    Neutro Abs 9.7 (*)    All other components within normal limits    EKG None  Radiology Ct Soft Tissue Neck W Contrast  Result Date: 01/29/2018 CLINICAL DATA:  Initial evaluation for palpable lump at right neck just below angle of mandible. EXAM: CT NECK WITH CONTRAST TECHNIQUE: Multidetector CT imaging of the neck was performed using the standard protocol following the bolus administration of intravenous contrast. CONTRAST:  75mL ISOVUE-300 IOPAMIDOL (ISOVUE-300) INJECTION 61% COMPARISON:   None. FINDINGS: Pharynx and larynx: Oral cavity within normal limits without mass lesion or loculated fluid collection. No acute abnormality about the dentition. Palatine tonsils symmetric and normal. Small calcified tonsillith noted on the left. Parapharyngeal fat maintained. Nasopharynx normal. No retropharyngeal collection. Epiglottis normal. Vallecula clear. Remainder of the hypopharynx and supraglottic larynx within normal limits. True cords symmetric and normal. Subglottic airway clear. Salivary glands: Metallic BB marker overlies the palpable area of concern at the lateral right neck. BB marker overlies the inferior aspect of the right parotid gland, which extends slightly more inferiorly as compared to the contralateral left. No discrete intraparotid mass or other focal lesions seen within this region. Few small normal appearing intraparotid lymph nodes noted. Few  punctate sialoliths noted within the right parotid gland. No imaging findings to suggest acute solid adenitis. Submandibular glands normal bilaterally. Thyroid: Normal. Lymph nodes: No pathologically enlarged lymph nodes identified within the neck. Vascular: Normal intravascular enhancement seen throughout the neck. Limited intracranial: Unremarkable. Visualized orbits: Unremarkable. Mastoids and visualized paranasal sinuses: Paranasal sinuses are clear. Small right mastoid effusion, likely benign/sterile. Middle ear cavities are clear. Skeleton: No acute osseous abnormality. No worrisome lytic or blastic osseous lesions. Upper chest: Visualized upper chest within normal limits. Visualized lungs are clear. Other: None. IMPRESSION: 1. Palpable abnormality concern at the right lateral neck overlies the inferior aspect of the right parotid gland which is normal in appearance. No intraparotid mass or other abnormality identified. 2. Right parotid sialolithiasis. No evidence for acute sialoadenitis. Electronically Signed   By: Rise MuBenjamin  McClintock M.D.    On: 01/29/2018 19:55    Procedures Procedures (including critical care time)  Medications Ordered in ED Medications - No data to display   Initial Impression / Assessment and Plan / ED Course  I have reviewed the triage vital signs and the nursing notes.  Pertinent labs & imaging results that were available during my care of the patient were reviewed by me and considered in my medical decision making (see chart for details).  Clinical Course as of Feb 01 1336  Wed Jan 29, 2018  2025 Patient CT did not show any specific abnormality where she was noting the swelling to be although they did see some element of stone in the parotid gland.  I reviewed this with her and I think it be reasonable to start her on some antibiotic coverage in cases of parotitis but also have her do sour candies and follow-up with ENT if she is not completely resolved.  She is comfortable with this plan and all questions were answered.   [MB]    Clinical Course User Index [MB] Terrilee FilesButler, Kura Bethards C, MD     Final Clinical Impressions(s) / ED Diagnoses   Final diagnoses:  Parotid gland pain    ED Discharge Orders         Ordered    amoxicillin-clavulanate (AUGMENTIN) 875-125 MG tablet  Every 12 hours     01/29/18 2027           Terrilee FilesButler, Maleah Rabago C, MD 01/31/18 [phone]

## 2018-01-29 NOTE — Discharge Instructions (Signed)
Your evaluated in the emergency department for acute pain around your right ear and jaw.  You had blood work and a CAT scan and it seems to be related to your parotid gland.  There is possibly a stone blocking the doctor or with her may be an infection.  We are starting you on some antibiotics and you should use sour candies to suck on to stimulate saliva production.  This usually will resolve this condition completely but if you have any worsening problems we are giving the number for an ear nose throat to follow-up with.

## 2018-01-29 NOTE — ED Triage Notes (Signed)
Pt c/o swelling on the R side of her neck. Pt endorses difficulty swallowing denies difficulty breathing. Pt's lung sounds clear and pt in NAD during triage.

## 2018-02-06 DIAGNOSIS — K115 Sialolithiasis: Secondary | ICD-10-CM | POA: Diagnosis not present

## 2018-02-06 DIAGNOSIS — Z87891 Personal history of nicotine dependence: Secondary | ICD-10-CM | POA: Diagnosis not present

## 2018-05-13 DIAGNOSIS — H6505 Acute serous otitis media, recurrent, left ear: Secondary | ICD-10-CM | POA: Diagnosis not present

## 2018-05-13 DIAGNOSIS — H6993 Unspecified Eustachian tube disorder, bilateral: Secondary | ICD-10-CM | POA: Insufficient documentation

## 2018-05-13 DIAGNOSIS — H6983 Other specified disorders of Eustachian tube, bilateral: Secondary | ICD-10-CM | POA: Diagnosis not present

## 2018-08-01 DIAGNOSIS — Z23 Encounter for immunization: Secondary | ICD-10-CM | POA: Diagnosis not present

## 2019-03-09 DIAGNOSIS — R509 Fever, unspecified: Secondary | ICD-10-CM | POA: Diagnosis not present

## 2019-07-17 ENCOUNTER — Telehealth: Payer: Self-pay | Admitting: Internal Medicine

## 2019-07-17 NOTE — Telephone Encounter (Signed)
Okay to schedule NP appt at her convenience.  

## 2019-07-17 NOTE — Telephone Encounter (Signed)
That is okay.  Thank you 

## 2019-07-17 NOTE — Telephone Encounter (Signed)
Pt used to see Dr. Zola Button. Last OV with her in 2014. Pt saw Dr. Drue Novel in 2015 for an acute appt. Pt said her sister is pt of Dr. Drue Novel and she would like to establish with him. Please advise.

## 2019-07-17 NOTE — Telephone Encounter (Signed)
Please advise 

## 2019-08-04 DIAGNOSIS — Z800 Family history of malignant neoplasm of digestive organs: Secondary | ICD-10-CM | POA: Insufficient documentation

## 2019-08-04 DIAGNOSIS — Z803 Family history of malignant neoplasm of breast: Secondary | ICD-10-CM | POA: Insufficient documentation

## 2019-08-05 ENCOUNTER — Other Ambulatory Visit: Payer: Self-pay

## 2019-08-05 ENCOUNTER — Ambulatory Visit (HOSPITAL_BASED_OUTPATIENT_CLINIC_OR_DEPARTMENT_OTHER)
Admission: RE | Admit: 2019-08-05 | Discharge: 2019-08-05 | Disposition: A | Discharge status: Home / Self Care | Payer: BC Managed Care – PPO | Source: Ambulatory Visit | Attending: Internal Medicine | Admitting: Internal Medicine

## 2019-08-05 ENCOUNTER — Encounter: Payer: Self-pay | Admitting: Gastroenterology

## 2019-08-05 ENCOUNTER — Ambulatory Visit (INDEPENDENT_AMBULATORY_CARE_PROVIDER_SITE_OTHER): Payer: BC Managed Care – PPO | Admitting: Internal Medicine

## 2019-08-05 ENCOUNTER — Encounter: Payer: Self-pay | Admitting: Internal Medicine

## 2019-08-05 VITALS — BP 123/76 | HR 80 | Temp 97.6°F | Resp 16 | Ht 67.0 in | Wt 246.0 lb

## 2019-08-05 DIAGNOSIS — Z114 Encounter for screening for human immunodeficiency virus [HIV]: Secondary | ICD-10-CM | POA: Diagnosis not present

## 2019-08-05 DIAGNOSIS — Z0000 Encounter for general adult medical examination without abnormal findings: Secondary | ICD-10-CM

## 2019-08-05 DIAGNOSIS — R1012 Left upper quadrant pain: Secondary | ICD-10-CM | POA: Diagnosis not present

## 2019-08-05 DIAGNOSIS — E669 Obesity, unspecified: Secondary | ICD-10-CM

## 2019-08-05 DIAGNOSIS — Z800 Family history of malignant neoplasm of digestive organs: Secondary | ICD-10-CM | POA: Diagnosis not present

## 2019-08-05 LAB — LIPID PANEL
Cholesterol: 192 mg/dL (ref 0–200)
HDL: 49.7 mg/dL (ref >39.00)
LDL Cholesterol: 126 mg/dL — ABNORMAL HIGH (ref 0–99)
NonHDL: 142.39
Total CHOL/HDL Ratio: 4
Triglycerides: 84 mg/dL (ref 0.0–149.0)
VLDL: 16.8 mg/dL (ref 0.0–40.0)

## 2019-08-05 LAB — CBC WITH DIFFERENTIAL/PLATELET
Basophils Absolute: 0 10*3/uL (ref 0.0–0.1)
Basophils Relative: 0.4 % (ref 0.0–3.0)
Eosinophils Absolute: 0.3 10*3/uL (ref 0.0–0.7)
Eosinophils Relative: 2.7 % (ref 0.0–5.0)
HCT: 44.9 % (ref 36.0–46.0)
Hemoglobin: 15.1 g/dL — ABNORMAL HIGH (ref 12.0–15.0)
Lymphocytes Relative: 28.6 % (ref 12.0–46.0)
Lymphs Abs: 3 10*3/uL (ref 0.7–4.0)
MCHC: 33.7 g/dL (ref 30.0–36.0)
MCV: 89.6 fl (ref 78.0–100.0)
Monocytes Absolute: 0.8 10*3/uL (ref 0.1–1.0)
Monocytes Relative: 7.3 % (ref 3.0–12.0)
Neutro Abs: 6.5 10*3/uL (ref 1.4–7.7)
Neutrophils Relative %: 61 % (ref 43.0–77.0)
Platelets: 250 10*3/uL (ref 150.0–400.0)
RBC: 5 Mil/uL (ref 3.87–5.11)
RDW: 14 % (ref 11.5–15.5)
WBC: 10.6 10*3/uL — ABNORMAL HIGH (ref 4.0–10.5)

## 2019-08-05 LAB — TSH: TSH: 1.48 u[IU]/mL (ref 0.35–4.50)

## 2019-08-05 LAB — COMPREHENSIVE METABOLIC PANEL
ALT: 8 U/L (ref 0–35)
AST: 12 U/L (ref 0–37)
Albumin: 4.3 g/dL (ref 3.5–5.2)
Alkaline Phosphatase: 53 U/L (ref 39–117)
BUN: 11 mg/dL (ref 6–23)
CO2: 25 mEq/L (ref 19–32)
Calcium: 9.4 mg/dL (ref 8.4–10.5)
Chloride: 104 mEq/L (ref 96–112)
Creatinine, Ser: 0.72 mg/dL (ref 0.40–1.20)
GFR: 92.31 mL/min (ref >60.00)
Glucose, Bld: 88 mg/dL (ref 70–99)
Potassium: 4.2 mEq/L (ref 3.5–5.1)
Sodium: 138 mEq/L (ref 135–145)
Total Bilirubin: 0.5 mg/dL (ref 0.2–1.2)
Total Protein: 6.9 g/dL (ref 6.0–8.3)

## 2019-08-05 LAB — HIV ANTIBODY (ROUTINE TESTING W REFLEX): HIV 1&2 Ab, 4th Generation: NONREACTIVE

## 2019-08-05 NOTE — Progress Notes (Signed)
Subjective:    Patient ID: Shari Small, female    DOB: Jun 18, 1986, 34 y.o.   MRN: [id number]  DOS:  08/05/2019 Type of visit - description: New patient In general feeling well. She is concerned about her family history LMP: 08/15/2019, not sexually active.  Not on birth control About 3 months history of episodic pain at the distal left rib cage/LUQ abdomen, Symptoms are typically 2 hours postprandial, particularly if she eats a large meal. Denies any rash, injury. Some nausea but no vomiting, no diarrhea or blood in the stools. Has a long history (years) off episodic heartburn.  Review of Systems See above   Past Medical History:  Diagnosis Date  . Heterotopic ossification 02/2013   right thigh    Past Surgical History:  Procedure Laterality Date  . INCISION AND DRAINAGE HIP Right 03/19/2013   RIGHT THIGH EXCISION  HETEROTOPIC OSSIFICATIONS;  Surgeon: Ninetta Lights, MD;  Location: [facility];  Service: Orthopedics;  Laterality: Right;  . WISDOM TOOTH EXTRACTION  2004   Family History  Problem Relation Age of Onset  . Heart disease Father        A-Fib  . Diabetes Father   . Heart disease Paternal Grandmother        A-fib with pacemaker  . Colon cancer Paternal Grandfather   . Breast cancer Sister        Right  . Colon cancer Sister   . Melanoma Maternal Grandmother   . Breast cancer Maternal Aunt        mastectomy    Allergies as of 08/05/2019      Reactions   Tussin [guaifenesin] Hives      Medication List       Accurate as of August 05, 2019 11:59 PM. If you have any questions, ask your nurse or doctor.        STOP taking these medications   amoxicillin-clavulanate 875-125 MG tablet Commonly known as: AUGMENTIN Stopped by: Kathlene November, MD   HYDROcodone-acetaminophen 5-325 MG tablet Commonly known as: NORCO/VICODIN Stopped by: Kathlene November, MD             Objective:   Physical Exam BP 123/76 (BP Location: Left Arm, Patient  Position: Sitting, Cuff Size: Small)   Pulse 80   Temp 97.6 F (36.4 C) (Temporal)   Resp 16   Ht 5\' 7"  (1.702 m)   Wt 246 lb (111.6 kg)   LMP 07/15/2019   SpO2 96%   BMI 38.53 kg/m  General: Well developed, NAD, BMI noted Neck: No  thyromegaly  HEENT:  Normocephalic . Face symmetric, atraumatic Lungs:  CTA B Normal respiratory effort, no intercostal retractions, no accessory muscle use. Heart: RRR,  no murmur.  Abdomen:  Not distended, soft, non-tender. No rebound or rigidity. Specifically, the LUQ abdomen & rib cage is normal to inspection and palpation with no pain Lower extremities: no pretibial edema bilaterally  Skin: Exposed areas without rash. Not pale. Not jaundice Neurologic:  alert & oriented X3.  Speech normal, gait appropriate for age and unassisted Strength symmetric and appropriate for age.  Psych: Cognition and judgment appear intact.  Cooperative with normal attention span and concentration.  Behavior appropriate. No anxious or depressed appearing.     Assessment    ASSESSMENT New patient (referred by her sister) Heterotopic ossification (s/p  surgery for removal of calcification around the hip due to a hematoma) Parotid gland stones/parotitis Obesity Family history: -Sister has colon cancer age 41and  breast cancer age 64   -GF: colon ca age 13   PLAN New patient, here for CPX Obesity: The patient reports she has been heavyset most of her life however 3 years ago she really started to increase her weight approximately by 50 pounds. Diet exercise encourage, she plans to do that.  Consider the wellness center, weight watchers or calorie counting Pain,LUQ/rib pain: Symptoms triggered by food, GI related?  We will get x-ray, ultrasound of the abdomen, consider empiric PPIs.  Also encouraged to discuss with GI. RTC 1 year   This visit occurred during the SARS-CoV-2 public health emergency.  Safety protocols were in place, including screening questions  prior to the visit, additional usage of staff PPE, and extensive cleaning of exam room while observing appropriate contact time as indicated for disinfecting solutions.

## 2019-08-05 NOTE — Patient Instructions (Addendum)
GO TO THE LAB : Get the blood work     GO TO THE FRONT DESK Come back for a physical exam in 1 year, please make an appointment  STOP BY THE FIRST FLOOR: Get x-ray, schedule your ultrasound   We are referring you to the gastroenterologist  Please consider weight watchers, calorie counting or a visit to the wellness clinic

## 2019-08-05 NOTE — Progress Notes (Signed)
Pre visit review using our clinic review tool, if applicable. No additional management support is needed unless otherwise documented below in the visit note. 

## 2019-08-06 DIAGNOSIS — Z09 Encounter for follow-up examination after completed treatment for conditions other than malignant neoplasm: Secondary | ICD-10-CM | POA: Insufficient documentation

## 2019-08-06 DIAGNOSIS — E669 Obesity, unspecified: Secondary | ICD-10-CM | POA: Insufficient documentation

## 2019-08-06 DIAGNOSIS — Z0000 Encounter for general adult medical examination without abnormal findings: Secondary | ICD-10-CM | POA: Insufficient documentation

## 2019-08-06 NOTE — Assessment & Plan Note (Signed)
-   Td 2019 - had a flu shot - +FH Colon cancer sister, very early age, refer to GI for consideration of a colonoscopy -Female care, plans to schedule a visit with gynecology, most likely she will need clinical breast exam and mammogram (+ FH of breast cancer at early age). -Labs: CMP, FLP, CBC, TSH, HIV -Diet exercise discussed

## 2019-08-06 NOTE — Assessment & Plan Note (Signed)
New patient, here for CPX Obesity: The patient reports she has been heavyset most of her life however 3 years ago she really started to increase her weight approximately by 50 pounds. Diet exercise encourage, she plans to do that.  Consider the wellness center, weight watchers or calorie counting Pain,LUQ/rib pain: Symptoms triggered by food, GI related?  We will get x-ray, ultrasound of the abdomen, consider empiric PPIs.  Also encouraged to discuss with GI. RTC 1 year

## 2019-09-01 ENCOUNTER — Encounter: Payer: BC Managed Care – PPO | Admitting: Gastroenterology

## 2019-12-24 ENCOUNTER — Encounter: Payer: Self-pay | Admitting: Internal Medicine

## 2020-03-17 DIAGNOSIS — J988 Other specified respiratory disorders: Secondary | ICD-10-CM | POA: Diagnosis not present

## 2020-06-06 DIAGNOSIS — J988 Other specified respiratory disorders: Secondary | ICD-10-CM | POA: Diagnosis not present

## 2020-08-25 ENCOUNTER — Encounter: Payer: Self-pay | Admitting: Internal Medicine

## 2020-09-09 DIAGNOSIS — Z01419 Encounter for gynecological examination (general) (routine) without abnormal findings: Secondary | ICD-10-CM | POA: Diagnosis not present

## 2020-09-09 DIAGNOSIS — Z6839 Body mass index (BMI) 39.0-39.9, adult: Secondary | ICD-10-CM | POA: Diagnosis not present

## 2020-09-28 ENCOUNTER — Other Ambulatory Visit: Payer: Self-pay

## 2020-09-28 ENCOUNTER — Ambulatory Visit: Payer: BC Managed Care – PPO | Admitting: Family Medicine

## 2020-09-28 ENCOUNTER — Encounter: Payer: Self-pay | Admitting: Family Medicine

## 2020-09-28 VITALS — BP 142/82 | HR 95 | Temp 98.1°F | Ht 66.0 in | Wt 243.2 lb

## 2020-09-28 DIAGNOSIS — M7989 Other specified soft tissue disorders: Secondary | ICD-10-CM

## 2020-09-28 NOTE — Patient Instructions (Signed)
We will be in touch with you regarding your CT scan results.  Someone should reach out to schedule this in the next couple business days.  Let us know if you need anything.

## 2020-09-28 NOTE — Progress Notes (Signed)
Chief Complaint  Patient presents with  . Shoulder Pain    Right shoulder pain Lump on on right side behind collar bone    Shari Small is a 35 y.o. female here for a skin complaint.  Duration: 1 day Location: R collar bone Pruritic? No Painful? Yes Drainage? No New soaps/lotions/topicals/detergents? No Sick contacts? No Other associated symptoms:  No fevers, injury or redness Therapies tried thus far: none  Past Medical History:  Diagnosis Date  . Heterotopic ossification 02/2013   right thigh    BP (!) 142/82 (BP Location: Left Arm, Patient Position: Sitting, Cuff Size: Large)   Pulse 95   Temp 98.1 F (36.7 C) (Oral)   Ht 5\' 6"  (1.676 m)   Wt 243 lb 4 oz (110.3 kg)   SpO2 97%   BMI 39.26 kg/m  Gen: awake, alert, appearing stated age Lungs: No accessory muscle use Skin: I do not appreciate any discreet soft tissue mass over the clavicle. No drainage, erythema, TTP, fluctuance, excoriation Neuro: 4/5 strength with R elbow flexion, 5/5 strength throughout UE's otherwise. 2/4 biceps reflex b/l w no clonus.  Psych: Age appropriate judgment and insight  Soft tissue mass - Plan: MR CHEST W WO CONTRAST  New diagnosis, uncertain prognosis. Spoke w radiology team regarding ideal order for this. Rec'd MRI chest w wo, R brachial plexus protocol.  BP elevated related to anxiety. Will monitor moving forward.  F/u prn. The patient voiced understanding and agreement to the plan.  [place], DO 09/28/20 3:24 PM

## 2020-09-28 NOTE — Addendum Note (Signed)
Addended by: Radene Gunning on: 09/28/2020 04:30 PM   Modules accepted: Orders

## 2020-10-03 ENCOUNTER — Telehealth: Payer: Self-pay | Admitting: Family Medicine

## 2020-10-03 NOTE — Telephone Encounter (Signed)
I received a notice of denial for this patient. We can order an X-ray if it will help insurance pay for it, but I would prefer to do a peer to peer, I did not see that option on the paperwork?

## 2020-10-05 NOTE — Telephone Encounter (Signed)
Called number listed regarding case. Explained that what the pt was describing sounds like a soft tissue mass. I had discussed this with our regional radiology team and was recommended to check an MRI chest w and wo, and to order the brachial plexus protocol for the R side. Left message with appeals team.

## 2020-10-10 ENCOUNTER — Other Ambulatory Visit: Payer: Self-pay | Admitting: Family Medicine

## 2020-10-10 NOTE — Telephone Encounter (Signed)
Spoke w [employer] and was told this did not qualify for a peer to peer and our referral team should reach back out to [facility] and let them know the appeal to [employer] was denied. To reiterate, the pt was having UE neurologic symptoms and we wanted an MRI done through the brachial plexus protocol, not of the arm pit. TY.

## 2020-10-28 ENCOUNTER — Other Ambulatory Visit: Payer: Self-pay

## 2020-10-28 DIAGNOSIS — M25511 Pain in right shoulder: Secondary | ICD-10-CM

## 2020-11-08 ENCOUNTER — Other Ambulatory Visit: Payer: Self-pay

## 2020-11-08 ENCOUNTER — Ambulatory Visit: Payer: BC Managed Care – PPO | Admitting: Family Medicine

## 2020-11-08 VITALS — BP 126/90 | HR 61 | Ht 66.0 in | Wt 248.8 lb

## 2020-11-08 DIAGNOSIS — M62838 Other muscle spasm: Secondary | ICD-10-CM

## 2020-11-08 NOTE — Patient Instructions (Signed)
Thank you for coming in today.  I've referred you to Physical Therapy.  Let us know if you don't hear from them in one week.  Try a heating pad.   If this gets worse let me know. I can use muscle relaxers.   Recheck with me in 6 weeks.

## 2020-11-08 NOTE — Progress Notes (Signed)
I, Philbert Riser, LAT, ATC acting as a scribe for Clementeen Graham, MD.  Subjective:    I'm seeing this patient as a consultation for: Dr. Arva Chafe. Note will be routed back to referring provider/PCP.  CC: Right clavicular pain  HPI: Pt is a 35 y/o female c/o R clavicle pain x 1 month w/ no known MOI. Pt locates pain to superior to the R clavicle. Pt notes there is a "bulge" in the area and radiates into inferior portion of shoulder and biceps. Pt reports when she went [place] a [place] and [place] the [place] the pain increased.  Neck pain: no Radiates: yes Mechanical symptoms: no UE numbness/tingling: yes UE weakness: yes- has improved, was 50% Aggravates: increased elevation, sleeping on side,  Treatments tried: IBU   Past medical history, Surgical history, Family history, Social history, Allergies, and medications have been entered into the medical record, reviewed.   Review of Systems: No new headache, visual changes, nausea, vomiting, diarrhea, constipation, dizziness, abdominal pain, skin rash, fevers, chills, night sweats, weight loss, swollen lymph nodes, body aches, joint swelling, muscle aches, chest pain, shortness of breath, mood changes, visual or auditory hallucinations.   Objective:    Vitals:   11/08/20 1527  BP: 126/90  Pulse: 61  SpO2: 98%   General: Well Developed, well nourished, and in no acute distress.  Neuro/Psych: Alert and oriented x3, extra-ocular muscles intact, able to move all 4 extremities, sensation grossly intact. Skin: Warm and dry, no rashes noted.  Respiratory: Not using accessory muscles, speaking in full sentences, trachea midline.  Cardiovascular: Pulses palpable, no extremity edema. Abdomen: Does not appear distended. MSK: C-spine largely normal-appearing No obvious bulging per my interpretation. Mildly tender palpation anterior cervical area at sternocleidomastoid insertion onto clavicle. Normal cervical motion. Extremity strength  is intact. Negative Spurling's test.  No lymphadenopathy palpated anterior cervical chain or supraclavicular area.  IMAGING:  EXAM: CT NECK WITH CONTRAST  TECHNIQUE: Multidetector CT imaging of the neck was performed using the standard protocol following the bolus administration of intravenous contrast.  CONTRAST:  72mL ISOVUE-300 IOPAMIDOL (ISOVUE-300) INJECTION 61%  COMPARISON:  None.  FINDINGS: Pharynx and larynx: Oral cavity within normal limits without mass lesion or loculated fluid collection. No acute abnormality about the dentition. Palatine tonsils symmetric and normal. Small calcified tonsillith noted on the left. Parapharyngeal fat maintained. Nasopharynx normal. No retropharyngeal collection. Epiglottis normal. Vallecula clear. Remainder of the hypopharynx and supraglottic larynx within normal limits. True cords symmetric and normal. Subglottic airway clear.  Salivary glands: Metallic BB marker overlies the palpable area of concern at the lateral right neck. BB marker overlies the inferior aspect of the right parotid gland, which extends slightly more inferiorly as compared to the contralateral left. No discrete intraparotid mass or other focal lesions seen within this region. Few small normal appearing intraparotid lymph nodes noted. Few punctate sialoliths noted within the right parotid gland. No imaging findings to suggest acute solid adenitis. Submandibular glands normal bilaterally.  Thyroid: Normal.  Lymph nodes: No pathologically enlarged lymph nodes identified within the neck.  Vascular: Normal intravascular enhancement seen throughout the neck.  Limited intracranial: Unremarkable.  Visualized orbits: Unremarkable.  Mastoids and visualized paranasal sinuses: Paranasal sinuses are clear. Small right mastoid effusion, likely benign/sterile. Middle ear cavities are clear.  Skeleton: No acute osseous abnormality. No worrisome lytic  or blastic osseous lesions.  Upper chest: Visualized upper chest within normal limits. Visualized lungs are clear.  Other: None.  IMPRESSION: 1. Palpable abnormality concern  at the right lateral neck overlies the inferior aspect of the right parotid gland which is normal in appearance. No intraparotid mass or other abnormality identified. 2. Right parotid sialolithiasis. No evidence for acute sialoadenitis.   Electronically Signed   By: Rise Mu M.D.   On: 01/29/2018 19:55 I, Clementeen Graham, personally (independently) visualized and performed the interpretation of the images attached in this note.  Impression and Recommendations:    Assessment and Plan: 35 y.o. female with pain and some bulging at the area above the right clavicle.  I believe this is muscle spasm and dysfunction of the anterior cervical musculature.  No lymphadenopathy palpated.  No red flag signs or symptoms.  Plan for physical therapy focused mostly muscle spasm and dysfunction.  Recheck in 6 weeks.  If not better would consider advanced imaging. Patient had a CT scan of soft tissue neck with contrast in 2019.  For this issue she probably would need a CT scan of the neck and maybe even of the chest to evaluate this area fully.  That would be quite a lot of radiation.  Would like to avoid further radiation if possible.  PDMP not reviewed this encounter. Orders Placed This Encounter  Procedures  . Ambulatory referral to Physical Therapy    Referral Priority:   Routine    Referral Type:   Physical Medicine    Referral Reason:   Specialty Services Required    Requested Specialty:   Physical Therapy   No orders of the defined types were placed in this encounter.   Discussed warning signs or symptoms. Please see discharge instructions. Patient expresses understanding.  The above documentation has been reviewed and is accurate and complete Clementeen Graham, M.D.

## 2020-11-22 ENCOUNTER — Ambulatory Visit: Payer: BC Managed Care – PPO | Attending: Family Medicine | Admitting: Physical Therapy

## 2020-11-22 ENCOUNTER — Other Ambulatory Visit: Payer: Self-pay

## 2020-11-22 ENCOUNTER — Encounter: Payer: Self-pay | Admitting: Physical Therapy

## 2020-11-22 DIAGNOSIS — M542 Cervicalgia: Secondary | ICD-10-CM | POA: Insufficient documentation

## 2020-11-22 DIAGNOSIS — R252 Cramp and spasm: Secondary | ICD-10-CM | POA: Insufficient documentation

## 2020-11-22 DIAGNOSIS — M6281 Muscle weakness (generalized): Secondary | ICD-10-CM | POA: Insufficient documentation

## 2020-11-22 DIAGNOSIS — R293 Abnormal posture: Secondary | ICD-10-CM | POA: Diagnosis not present

## 2020-11-22 NOTE — Patient Instructions (Signed)
Access Code: [email or web address] URL: [email or web address] Date: 11/22/2020 Prepared by: Lysle Rubens  Exercises Seated Scapular Retraction - 1 x daily - 7 x weekly - 2 sets - 10 reps - 3 sec hold Shoulder External Rotation and Scapular Retraction with Resistance - 1 x daily - 7 x weekly - 2 sets - 10 reps Seated Upper Trapezius Stretch - 1 x daily - 7 x weekly - 2 sets - 2 reps - 20 sec hold Seated Levator Scapulae Stretch - 1 x daily - 7 x weekly - 2 sets - 2 reps - 20 sec hold

## 2020-11-22 NOTE — Therapy (Signed)
[facility] [place] [facility] [address] [place], [place], [address] Phone: [phone]   Fax:  [phone]  Physical Therapy Evaluation  Patient Details  Name: Shari Small MRN: [id number] Date of Birth: 1986-02-01 Referring Provider (PT): Cheron Every Date: 11/22/2020   PT End of Session - 11/22/20 1638    Visit Number 1    Date for PT Re-Evaluation 01/22/21    PT Start Time 1600    PT Stop Time 1635    PT Time Calculation (min) 35 min    Activity Tolerance Patient tolerated treatment well    Behavior During Therapy [facility] for tasks assessed/performed           Past Medical History:  Diagnosis Date  . Heterotopic ossification 02/2013   right thigh    Past Surgical History:  Procedure Laterality Date  . INCISION AND DRAINAGE HIP Right 03/19/2013   RIGHT THIGH EXCISION  HETEROTOPIC OSSIFICATIONS;  Surgeon: Loreta Ave, MD;  Location: [facility];  Service: Orthopedics;  Laterality: Right;  . WISDOM TOOTH EXTRACTION  2004    There were no vitals filed for this visit.    Subjective Assessment - 11/22/20 1601    Subjective Pt reports 5-6 weeks ago she woke up with R arm numb. This went away when she sat up and got moving but she was left with feeling of pressure superior to R clavicle. Pt reports no further instances of R arm numbness. She saw her PCP and sports med who she states think she is having muscle spasms. Pt reports she feels slight protuberance when she tilts head to the L side. Pt unable to discern particular aggravating factors but states that it is increased the day after doing a lot of lifting. No imaging yet. Pt does endorse occasional pain radiating into R biceps and intermittent tingling in 4th/5th digits.    Limitations Lifting    Diagnostic tests none    Patient Stated Goals get rid of the pain    Currently in Pain? Yes    Pain Score 3     Pain Location Neck    Pain Orientation Right    Pain  Descriptors / Indicators Burning    Pain Type Acute pain    Pain Radiating Towards occasionally radiates down course of ulnar nerve    Pain Onset More than a month ago    Pain Frequency Constant    Aggravating Factors  lifting    Pain Relieving Factors unable to identify relieving factors; describes pain as constant              [facility] PT Assessment - 11/22/20 0001      Assessment   Medical Diagnosis neck spasm    Referring Provider (PT) Denyse Amass    Onset Date/Surgical Date --   5 weeks ago   Hand Dominance Right    Next MD Visit --   6 weeks   Prior Therapy none      Precautions   Precautions None      Restrictions   Weight Bearing Restrictions No      Balance Screen   Has the patient fallen in the past 6 months No    Has the patient had a decrease in activity level because of a fear of falling?  No    Is the patient reluctant to leave their home because of a fear of falling?  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    Leisure hiking, Chartered loss adjuster Appears Intact      Posture/Postural Control   Posture/Postural Control Postural limitations    Postural Limitations Rounded Shoulders;Forward head      ROM / Strength   AROM / PROM / Strength AROM;Strength      AROM   Overall AROM Comments cervical and shoulder AROM WFL; mild tightness/pressure increased at end ranges of shoulder ROM      Strength   Overall Strength Comments BUE 5/5      Palpation   Palpation comment tender to palpation R UT, R biceps tendon; palpable tightness of R cervical musculature as compared to L      Special Tests    Special Tests Cervical;Thoracic Outlet Syndrome;Rotator Cuff Impingement    Cervical Tests Spurling's    Thoracic Outlet Syndrome  Adson  Test;Arms overhead    Rotator Cuff Impingment tests Empty Can test;Speed's test      Spurling's   Findings Negative    Side Right      Adson Test   Comment no change in radial pulse but did note increased numbness in R hand relieved with return from test position      Arms overhead    Findings Negative    Side Right      Empty Can test   Findings Negative    Side Right      Speed's test   Findings Negative    Side Right    Comment did note mild R biceps tendon pain with test                      Objective measurements completed on examination: See above findings.               PT Education - 11/22/20 1638    Education Details Pt educated on POC and HEP    Person(s) Educated Patient    Methods Explanation;Demonstration;Handout    Comprehension Verbalized understanding;Returned demonstration            PT Short Term Goals - 11/22/20 1827      PT SHORT TERM GOAL #1   Title Pt will be I with initial HEP    Time 2    Period Weeks    Status New    Target Date 12/06/20             PT Long Term Goals - 11/22/20 1827      PT LONG TERM GOAL #1   Title Pt will be I with advanced HEP    Time 6    Period Weeks    Status New    Target Date 01/03/21      PT LONG TERM GOAL #2   Title Pt will report 50% reduction in R sided cervical/UT pain    Time 6    Period Weeks    Status New    Target Date 01/03/21      PT LONG TERM GOAL #3   Title Pt will demo understanding of posture/body mechanics    Time 6    Period Weeks    Status New    Target Date 01/03/21      PT LONG TERM  GOAL #4   Title Pt will demo FOTO improved to 78%    Baseline 68%; 60% risk adjusted    Time 6    Period Weeks    Status New    Target Date 01/03/21                  Plan - 11/22/20 1814    Clinical Impression Statement Pt presents to clinic with reports of pain/pressure located superior to R clavicle, no clear MOI. Pt demos AROM cervical/shoulder WFL  but mild pain/pressure R UT with end range shoulder AROM. D/t c/o of burning pain and intermittent nerve like pain, tested for thoracic outlet syndrome. Overhead test negative. Adson's test did not have any decrease in radial pulse; however, did reproduce N/T in hands which resolved after moving out of test position. Pt also demos palpable tightness of R cervical paraspinals and UT with tenderness to palpation. Sits with mild thoracic kyphosis and FHP, demos weakness of postural muscles. Pt would benefit from skilled PT to address cervical tightness along with postural strength deficits to return to PLOF.    Examination-Participation Restrictions Community Activity;Interpersonal Relationship    Stability/Clinical Decision Making Stable/Uncomplicated    Clinical Decision Making Low    Rehab Potential Good    PT Frequency 2x / week    PT Duration 6 weeks    PT Treatment/Interventions ADLs/Self Care Home Management;Electrical Stimulation;Iontophoresis 4mg /ml Dexamethasone;Moist Heat;Therapeutic exercise;Therapeutic activities;Patient/family education;Manual techniques;Dry needling;Passive range of motion;Taping    PT Next Visit Plan scap stab/postural stability, cervical/thoracic flexibility, manual/modalities as indicated    PT Home Exercise Plan see pt instructions    Consulted and Agree with Plan of Care Patient           Patient will benefit from skilled therapeutic intervention in order to improve the following deficits and impairments:  Increased muscle spasms,Pain,Impaired flexibility,Improper body mechanics,Decreased strength,Postural dysfunction  Visit Diagnosis: Cervicalgia  Cramp and spasm  Abnormal posture  Muscle weakness (generalized)     Problem List Patient Active Problem List   Diagnosis Date Noted  . Annual physical exam 08/06/2019  . Obesity (BMI 30-39.9) 08/06/2019  . PCP NOTES >>>>>>>>>>>>>>>>>>>>>> 08/06/2019  . Family history of breast cancer in sister  08/04/2019  . Family history of colon cancer in sister 08/04/2019  . Eustachian tube dysfunction, bilateral 05/13/2018  . Right leg pain 03/12/2013   03/14/2013, PT, DPT Lysle Rubens Abeni Finchum 11/22/2020, 6:36 PM  [facility] [place] [facility] [address] [place], [place], [place] Phone: [phone]   Fax:  [phone]  Name: Shari Small MRN: [name] Date of Birth: 1986/04/19

## 2020-11-29 ENCOUNTER — Encounter: Payer: Self-pay | Admitting: Physical Therapy

## 2020-11-29 ENCOUNTER — Ambulatory Visit: Payer: BC Managed Care – PPO | Admitting: Physical Therapy

## 2020-11-29 ENCOUNTER — Other Ambulatory Visit: Payer: Self-pay

## 2020-11-29 DIAGNOSIS — M542 Cervicalgia: Secondary | ICD-10-CM | POA: Diagnosis not present

## 2020-11-29 DIAGNOSIS — M6281 Muscle weakness (generalized): Secondary | ICD-10-CM

## 2020-11-29 DIAGNOSIS — R293 Abnormal posture: Secondary | ICD-10-CM | POA: Diagnosis not present

## 2020-11-29 DIAGNOSIS — R252 Cramp and spasm: Secondary | ICD-10-CM | POA: Diagnosis not present

## 2020-11-29 NOTE — Therapy (Signed)
[facility] [place] [facility] [address] [place], [place], [address] Phone: [phone]   Fax:  [phone]  Physical Therapy Treatment  Patient Details  Name: Shari Small MRN: [id number] Date of Birth: 05-Dec-1985 Referring Provider (PT): Cheron Every Date: 11/29/2020   PT End of Session - 11/29/20 1856     Visit Number 2    Date for PT Re-Evaluation 01/22/21    PT Start Time 1815    PT Stop Time 1856    PT Time Calculation (min) 41 min    Activity Tolerance Patient tolerated treatment well    Behavior During Therapy [facility] for tasks assessed/performed             Past Medical History:  Diagnosis Date   Heterotopic ossification 02/2013   right thigh    Past Surgical History:  Procedure Laterality Date   INCISION AND DRAINAGE HIP Right 03/19/2013   RIGHT THIGH EXCISION  HETEROTOPIC OSSIFICATIONS;  Surgeon: Loreta Ave, MD;  Location: [facility];  Service: Orthopedics;  Laterality: Right;   WISDOM TOOTH EXTRACTION  2004    There were no vitals filed for this visit.   Subjective Assessment - 11/29/20 1819     Subjective Pt states that problem is about the same; states exercises are going well.    Currently in Pain? No/denies    Pain Score 0-No pain    Pain Location Neck    Pain Orientation Right                               [facility] Adult PT Treatment/Exercise - 11/29/20 0001       Self-Care   Self-Care Other Self-Care Comments    Other Self-Care Comments  education on DN uses, benefits, and precautions via handout      Exercises   Exercises Neck      Neck Exercises: Machines for Strengthening   Nustep L5 x 6 min    Cybex Row 25# 2x10    Lat Pull 25# 2x10    Other Machines for Strengthening standing shoulder ext 10# 2x10    Other Machines for Strengthening standing rows 2x10 20#      Neck Exercises: Standing   Other Standing Exercises ball on wall x10 with 5 sec hold; 3#  2x10 standing shoulder flex/abd; 3# shoulder rolls    Other Standing Exercises red scap stab 3 ways x5 B      Neck Exercises: Supine   Neck Retraction 20 reps;3 secs    Neck Retraction Limitations into pillow; 2x10    Other Supine Exercise 3# chest press with shoulder protraction and 3 sec hold      Neck Exercises: Stretches   Other Neck Stretches standing open book thoracic rotation x5 B 5 sec hold    Other Neck Stretches 1/2 foam roll thoracic extension and ER stretch x10                      PT Short Term Goals - 11/29/20 1901       PT SHORT TERM GOAL #1   Title Pt will be I with initial HEP    Time 2    Period Weeks    Status Achieved    Target Date 12/06/20               PT Long Term Goals - 11/22/20 1827  PT LONG TERM GOAL #1   Title Pt will be I with advanced HEP    Time 6    Period Weeks    Status New    Target Date 01/03/21      PT LONG TERM GOAL #2   Title Pt will report 50% reduction in R sided cervical/UT pain    Time 6    Period Weeks    Status New    Target Date 01/03/21      PT LONG TERM GOAL #3   Title Pt will demo understanding of posture/body mechanics    Time 6    Period Weeks    Status New    Target Date 01/03/21      PT LONG TERM GOAL #4   Title Pt will demo FOTO improved to 78%    Baseline 68%; 60% risk adjusted    Time 6    Period Weeks    Status New    Target Date 01/03/21                   Plan - 11/29/20 1856     Clinical Impression Statement Pt tolerated progression to TE well. Did report mild pain R clavicle with seated chest press machine resolved after cessation of this exercise. No pain with supine chest press. Otherwise tolerated progression to TE well. Cues for posture/form with standing shoulder ext and seated rows. Discussed trial of DN to upper trap next rx.    PT Treatment/Interventions ADLs/Self Care Home Management;Electrical Stimulation;Iontophoresis 4mg /ml Dexamethasone;Moist  Heat;Therapeutic exercise;Therapeutic activities;Patient/family education;Manual techniques;Dry needling;Passive range of motion;Taping    PT Next Visit Plan dry needling, scap stab/postural stability, cervical/thoracic flexibility, manual/modalities as indicated    Consulted and Agree with Plan of Care Patient             Patient will benefit from skilled therapeutic intervention in order to improve the following deficits and impairments:  Increased muscle spasms, Pain, Impaired flexibility, Improper body mechanics, Decreased strength, Postural dysfunction  Visit Diagnosis: Cervicalgia  Cramp and spasm  Abnormal posture  Muscle weakness (generalized)     Problem List Patient Active Problem List   Diagnosis Date Noted   Annual physical exam 08/06/2019   Obesity (BMI 30-39.9) 08/06/2019   PCP NOTES >>>>>>>>>>>>>>>>>>>>>> 08/06/2019   Family history of breast cancer in sister 08/04/2019   Family history of colon cancer in sister 08/04/2019   Eustachian tube dysfunction, bilateral 05/13/2018   Right leg pain 03/12/2013   03/14/2013, PT, DPT Lysle Rubens Tavion Senkbeil 11/29/2020, 7:02 PM  [facility] [place] [facility] [address] [place], [place], [place] Phone: [phone]   Fax:  [phone]  Name: QUANTASIA STEGNER MRN: [name] Date of Birth: 1985/09/01

## 2020-11-29 NOTE — Patient Instructions (Signed)

## 2020-12-01 ENCOUNTER — Other Ambulatory Visit: Payer: Self-pay

## 2020-12-01 ENCOUNTER — Ambulatory Visit: Payer: BC Managed Care – PPO | Admitting: Physical Therapy

## 2020-12-01 ENCOUNTER — Encounter: Payer: Self-pay | Admitting: Physical Therapy

## 2020-12-01 DIAGNOSIS — M542 Cervicalgia: Secondary | ICD-10-CM | POA: Diagnosis not present

## 2020-12-01 DIAGNOSIS — R293 Abnormal posture: Secondary | ICD-10-CM | POA: Diagnosis not present

## 2020-12-01 DIAGNOSIS — M6281 Muscle weakness (generalized): Secondary | ICD-10-CM

## 2020-12-01 DIAGNOSIS — R252 Cramp and spasm: Secondary | ICD-10-CM | POA: Diagnosis not present

## 2020-12-01 NOTE — Therapy (Signed)
[facility] [address]. [place], [place], [address] Phone: [phone]   Fax:  [phone]  Physical Therapy Treatment  Patient Details  Name: BANITA LEHN MRN: [id number] Date of Birth: 07-21-1985 Referring Provider (PT): Cheron Every Date: 12/01/2020   PT End of Session - 12/01/20 1805     Visit Number 3    Date for PT Re-Evaluation 01/22/21    PT Start Time 1727    PT Stop Time 1814    PT Time Calculation (min) 47 min    Activity Tolerance Patient tolerated treatment well    Behavior During Therapy [facility] for tasks assessed/performed             Past Medical History:  Diagnosis Date   Heterotopic ossification 02/2013   right thigh    Past Surgical History:  Procedure Laterality Date   INCISION AND DRAINAGE HIP Right 03/19/2013   RIGHT THIGH EXCISION  HETEROTOPIC OSSIFICATIONS;  Surgeon: Loreta Ave, MD;  Location: [facility];  Service: Orthopedics;  Laterality: Right;   WISDOM TOOTH EXTRACTION  2004    There were no vitals filed for this visit.   Subjective Assessment - 12/01/20 1731     Subjective Reports increase in pain/soreness following last tx session; better today.    Currently in Pain? Yes    Pain Score 2     Pain Location Neck                               [facility] Adult PT Treatment/Exercise - 12/01/20 0001       Neck Exercises: Machines for Strengthening   UBE (Upper Arm Bike) L 1.5 x 3 min each    Cybex Row 25# 2x10    Lat Pull 25# 2x10    Other Machines for Strengthening standing shoulder ext 10# 2x10      Modalities   Modalities Electrical Stimulation;Moist Heat      Moist Heat Therapy   Number Minutes Moist Heat 10 Minutes    Moist Heat Location Cervical      Electrical Stimulation   Electrical Stimulation Location R Cervical    Electrical Stimulation Action IFC    Electrical Stimulation Parameters seated    Electrical Stimulation Goals  Pain      Manual Therapy   Manual Therapy Soft tissue mobilization    Soft tissue mobilization right upper trap and levator area      Neck Exercises: Marine scientist 1 rep;30 seconds    Other Neck Stretches bicep stretch x2 30 sec    Other Neck Stretches ball on wall x10 with 3-5 second hold; 1/2 foam roll angel stretch x5              Trigger Point Dry Needling - 12/01/20 0001     Consent Given? Yes    Education Handout Provided Yes    Muscles Treated Head and Neck Upper trapezius    Upper Trapezius Response Twitch reponse elicited;Palpable increased muscle length                    PT Short Term Goals - 11/29/20 1901       PT SHORT TERM GOAL #1   Title Pt will be I with initial HEP    Time 2    Period Weeks    Status Achieved    Target Date 12/06/20  PT Long Term Goals - 11/22/20 1827       PT LONG TERM GOAL #1   Title Pt will be I with advanced HEP    Time 6    Period Weeks    Status New    Target Date 01/03/21      PT LONG TERM GOAL #2   Title Pt will report 50% reduction in R sided cervical/UT pain    Time 6    Period Weeks    Status New    Target Date 01/03/21      PT LONG TERM GOAL #3   Title Pt will demo understanding of posture/body mechanics    Time 6    Period Weeks    Status New    Target Date 01/03/21      PT LONG TERM GOAL #4   Title Pt will demo FOTO improved to 78%    Baseline 68%; 60% risk adjusted    Time 6    Period Weeks    Status New    Target Date 01/03/21                   Plan - 12/01/20 1806     Clinical Impression Statement Pt reported increased soreness/pain following last rx, resolving today; decided to omit chest press exercise from today's tx. Trialed DN; assess response next rx. No c/o increased pain with other TE this rx. Cues for posture/form with seated rows and standing shoulder extension. Estim and heat at end of session to alleviate soreness from DN.    PT  Treatment/Interventions ADLs/Self Care Home Management;Electrical Stimulation;Iontophoresis 4mg /ml Dexamethasone;Moist Heat;Therapeutic exercise;Therapeutic activities;Patient/family education;Manual techniques;Dry needling;Passive range of motion;Taping    PT Next Visit Plan follow up on dry needling, scap stab/postural stability, cervical/thoracic flexibility, manual/modalities as indicated    Consulted and Agree with Plan of Care Patient             Patient will benefit from skilled therapeutic intervention in order to improve the following deficits and impairments:  Increased muscle spasms, Pain, Impaired flexibility, Improper body mechanics, Decreased strength, Postural dysfunction  Visit Diagnosis: Cervicalgia  Cramp and spasm  Abnormal posture  Muscle weakness (generalized)     Problem List Patient Active Problem List   Diagnosis Date Noted   Annual physical exam 08/06/2019   Obesity (BMI 30-39.9) 08/06/2019   PCP NOTES >>>>>>>>>>>>>>>>>>>>>> 08/06/2019   Family history of breast cancer in sister 08/04/2019   Family history of colon cancer in sister 08/04/2019   Eustachian tube dysfunction, bilateral 05/13/2018   Right leg pain 03/12/2013   03/14/2013, PT, DPT Lysle Rubens Jereline Ticer 12/01/2020, 6:08 PM  [facility] [address] [place], [place], [place] Phone: [phone]   Fax:  [phone]  Name: TAMICKA SHIMON MRN: [name] Date of Birth: Jul 02, 1985

## 2020-12-01 NOTE — Patient Instructions (Signed)

## 2020-12-06 ENCOUNTER — Encounter: Payer: Self-pay | Admitting: Physical Therapy

## 2020-12-06 ENCOUNTER — Other Ambulatory Visit: Payer: Self-pay

## 2020-12-06 ENCOUNTER — Ambulatory Visit: Payer: BC Managed Care – PPO | Admitting: Physical Therapy

## 2020-12-06 DIAGNOSIS — R252 Cramp and spasm: Secondary | ICD-10-CM

## 2020-12-06 DIAGNOSIS — R293 Abnormal posture: Secondary | ICD-10-CM

## 2020-12-06 DIAGNOSIS — M6281 Muscle weakness (generalized): Secondary | ICD-10-CM

## 2020-12-06 DIAGNOSIS — M542 Cervicalgia: Secondary | ICD-10-CM | POA: Diagnosis not present

## 2020-12-06 NOTE — Therapy (Signed)
[facility] [address] [place], [place], [address] Phone: [phone]   Fax:  [phone]  Physical Therapy Treatment  Patient Details  Name: Shari Small MRN: [id number] Date of Birth: 10-Feb-1986 Referring Provider (PT): Cheron Every Date: 12/06/2020   PT End of Session - 12/06/20 1812     Visit Number 4    Date for PT Re-Evaluation 01/22/21    PT Start Time 1732    PT Stop Time 1812    PT Time Calculation (min) 40 min    Activity Tolerance Patient tolerated treatment well    Behavior During Therapy [facility] for tasks assessed/performed             Past Medical History:  Diagnosis Date   Heterotopic ossification 02/2013   right thigh    Past Surgical History:  Procedure Laterality Date   INCISION AND DRAINAGE HIP Right 03/19/2013   RIGHT THIGH EXCISION  HETEROTOPIC OSSIFICATIONS;  Surgeon: Loreta Ave, MD;  [facility];  Service: Orthopedics;  Laterality: Right;   WISDOM TOOTH EXTRACTION  2004    There were no vitals filed for this visit.   Subjective Assessment - 12/06/20 1737     Subjective Pt reports that she thinks DN was helpful; decreased soreness/pain over the past few days    Currently in Pain? No/denies    Pain Score 0-No pain    Pain Location Neck                               [facility] Adult PT Treatment/Exercise - 12/06/20 0001       Neck Exercises: Machines for Strengthening   UBE (Upper Arm Bike) L 2 x 3 min each    Nustep L5 x 5 min    Cybex Row 25# 2x10    Lat Pull 25# 2x10    Other Machines for Strengthening standing shoulder ext 10# 2x10    Other Machines for Strengthening standing rows 2x10 15#      Neck Exercises: Standing   Other Standing Exercises shoulder flex/ext/IR with 3# weight bar x10 3 sec hold; ball on wall x10 3 sec hold    Other Standing Exercises red scap stab 3 ways x5 B; shoulder flex/abd 3# 2x10                       PT Short Term Goals - 11/29/20 1901       PT SHORT TERM GOAL #1   Title Pt will be I with initial HEP    Time 2    Period Weeks    Status Achieved    Target Date 12/06/20               PT Long Term Goals - 11/22/20 1827       PT LONG TERM GOAL #1   Title Pt will be I with advanced HEP    Time 6    Period Weeks    Status New    Target Date 01/03/21      PT LONG TERM GOAL #2   Title Pt will report 50% reduction in R sided cervical/UT pain    Time 6    Period Weeks    Status New    Target Date 01/03/21      PT LONG TERM GOAL #3   Title Pt will demo understanding of posture/body  mechanics    Time 6    Period Weeks    Status New    Target Date 01/03/21      PT LONG TERM GOAL #4   Title Pt will demo FOTO improved to 78%    Baseline 68%; 60% risk adjusted    Time 6    Period Weeks    Status New    Target Date 01/03/21                   Plan - 12/06/20 1812     Clinical Impression Statement Pt did well with ex's this rx. Focused on TE this rx with postural ex's, scap stab, and core strengthening. Some cuing for posture/form with standing ex's. No increase pain reported with TE. Try DN again if indicated next rx.    PT Treatment/Interventions ADLs/Self Care Home Management;Electrical Stimulation;Iontophoresis 4mg /ml Dexamethasone;Moist Heat;Therapeutic exercise;Therapeutic activities;Patient/family education;Manual techniques;Dry needling;Passive range of motion;Taping    PT Next Visit Plan follow up on dry needling, scap stab/postural stability, cervical/thoracic flexibility, manual/modalities as indicated    Consulted and Agree with Plan of Care Patient             Patient will benefit from skilled therapeutic intervention in order to improve the following deficits and impairments:  Increased muscle spasms, Pain, Impaired flexibility, Improper body mechanics, Decreased strength, Postural dysfunction  Visit  Diagnosis: Cervicalgia  Cramp and spasm  Abnormal posture  Muscle weakness (generalized)     Problem List Patient Active Problem List   Diagnosis Date Noted   Annual physical exam 08/06/2019   Obesity (BMI 30-39.9) 08/06/2019   PCP NOTES >>>>>>>>>>>>>>>>>>>>>> 08/06/2019   Family history of breast cancer in sister 08/04/2019   Family history of colon cancer in sister 08/04/2019   Eustachian tube dysfunction, bilateral 05/13/2018   Right leg pain 03/12/2013   03/14/2013, PT, DPT Lysle Rubens Demauri Advincula 12/06/2020, 6:18 PM  [facility] [address] [place], [place], [place] Phone: [phone]   Fax:  [phone]  Name: Shari Small MRN: Shari Small Date of Birth: Apr 21, 1986

## 2020-12-08 ENCOUNTER — Ambulatory Visit: Payer: BC Managed Care – PPO | Admitting: Physical Therapy

## 2020-12-16 ENCOUNTER — Ambulatory Visit: Payer: BC Managed Care – PPO | Admitting: Physical Therapy

## 2020-12-21 ENCOUNTER — Ambulatory Visit: Payer: BC Managed Care – PPO | Admitting: Physical Therapy

## 2020-12-22 ENCOUNTER — Ambulatory Visit: Payer: BC Managed Care – PPO | Attending: Family Medicine

## 2020-12-22 ENCOUNTER — Ambulatory Visit (INDEPENDENT_AMBULATORY_CARE_PROVIDER_SITE_OTHER): Payer: BC Managed Care – PPO | Admitting: Family Medicine

## 2020-12-22 ENCOUNTER — Other Ambulatory Visit: Payer: Self-pay

## 2020-12-22 VITALS — BP 126/80 | HR 73 | Ht 66.0 in | Wt 248.6 lb

## 2020-12-22 DIAGNOSIS — R293 Abnormal posture: Secondary | ICD-10-CM

## 2020-12-22 DIAGNOSIS — R252 Cramp and spasm: Secondary | ICD-10-CM

## 2020-12-22 DIAGNOSIS — M542 Cervicalgia: Secondary | ICD-10-CM | POA: Diagnosis not present

## 2020-12-22 DIAGNOSIS — M62838 Other muscle spasm: Secondary | ICD-10-CM | POA: Diagnosis not present

## 2020-12-22 DIAGNOSIS — M6281 Muscle weakness (generalized): Secondary | ICD-10-CM | POA: Diagnosis not present

## 2020-12-22 MED ORDER — TIZANIDINE HCL 4 MG PO TABS: 4.0000 mg | ORAL_TABLET | Freq: Three times a day (TID) | ORAL | 1 refills | Status: AC | PRN

## 2020-12-22 NOTE — Progress Notes (Signed)
I, Christoper Fabian, LAT, ATC, am serving as scribe for Dr. Clementeen Graham.  Shari Small is a 35 y.o. female who presents to [facility] today for f/u of neck/R clavicular pain.  She was last seen by Dr. Denyse Amass on 11/08/20 and was referred to PT of which she has completed 4 sessions.  Since her last visit, pt reports pain is less frequent and will come and go. Pt notes 15% improvement in pain. No numbness/tingling.  Additionally she still notes some bulging in the supraclavicular area on the right side.  This is not worsened or changed much.  She has had some concerns for this with this episode and previously.  She did have a CT scan of her soft tissue neck in 2019 for similar but unrelated issue that was normal.   Pertinent review of systems: No fevers or chills  Relevant historical information: Eustachian tube dysfunction.  Obesity.  Otherwise healthy.   Exam:  BP 126/80   Pulse 73   Ht 5\' 6"  (1.676 m)   Wt 248 lb 9.6 oz (112.8 kg)   SpO2 96%   BMI 40.13 kg/m  General: Well Developed, well nourished, and in no acute distress.   MSK: C-spine normal-appearing Normal cervical motion. Slight fullness right supraclavicular area with no discrete lymphadenopathy palpated. Nontender to palpation. Patient does have some pain located over the [facility] joint but this is nontender with no provocative AC tests are positive.  Negative crossarm compression test. Pulses cap refill and sensation are intact distally.    Lab and Radiology Results  EXAM: CT NECK WITH CONTRAST   TECHNIQUE: Multidetector CT imaging of the neck was performed using the standard protocol following the bolus administration of intravenous contrast.   CONTRAST:  2mL ISOVUE-300 IOPAMIDOL (ISOVUE-300) INJECTION 61%   COMPARISON:  None.   FINDINGS: Pharynx and larynx: Oral cavity within normal limits without mass lesion or loculated fluid collection. No acute abnormality about the dentition.  Palatine tonsils symmetric and normal. Small calcified tonsillith noted on the left. Parapharyngeal fat maintained. Nasopharynx normal. No retropharyngeal collection. Epiglottis normal. Vallecula clear. Remainder of the hypopharynx and supraglottic larynx within normal limits. True cords symmetric and normal. Subglottic airway clear.   Salivary glands: Metallic BB marker overlies the palpable area of concern at the lateral right neck. BB marker overlies the inferior aspect of the right parotid gland, which extends slightly more inferiorly as compared to the contralateral left. No discrete intraparotid mass or other focal lesions seen within this region. Few small normal appearing intraparotid lymph nodes noted. Few punctate sialoliths noted within the right parotid gland. No imaging findings to suggest acute solid adenitis. Submandibular glands normal bilaterally.   Thyroid: Normal.   Lymph nodes: No pathologically enlarged lymph nodes identified within the neck.   Vascular: Normal intravascular enhancement seen throughout the neck.   Limited intracranial: Unremarkable.   Visualized orbits: Unremarkable.   Mastoids and visualized paranasal sinuses: Paranasal sinuses are clear. Small right mastoid effusion, likely benign/sterile. Middle ear cavities are clear.   Skeleton: No acute osseous abnormality. No worrisome lytic or blastic osseous lesions.   Upper chest: Visualized upper chest within normal limits. Visualized lungs are clear.   Other: None.   IMPRESSION: 1. Palpable abnormality concern at the right lateral neck overlies the inferior aspect of the right parotid gland which is normal in appearance. No intraparotid mass or other abnormality identified. 2. Right parotid sialolithiasis. No evidence for acute sialoadenitis.  Electronically Signed   By: Rise Mu M.D.   On: 01/29/2018 19:55 I, Clementeen Graham, personally (independently) visualized and  performed the interpretation of the images attached in this note.      Assessment and Plan: 35 y.o. female with right lateral neck pain with some supraclavicular swelling/fullness.  Unclear etiology.  Thought primarily to be muscle dysfunction.  Patient has had mild improvement with physical therapy but certainly not good enough.  After discussion plan for treatment Cedilanid-D At bedtime.  If this is not sufficient would recommend proceeding to advanced imaging.  Probably the best test would be CT scan with contrast of the neck and chest to further characterize this however I will double check with radiology before ordering the test.  Patient will keep me updated.   PDMP not reviewed this encounter. No orders of the defined types were placed in this encounter.  Meds ordered this encounter  Medications   tiZANidine (ZANAFLEX) 4 MG tablet    Sig: Take 1 tablet (4 mg total) by mouth every 8 (eight) hours as needed for muscle spasms.    Dispense:  60 tablet    Refill:  1     Discussed warning signs or symptoms. Please see discharge instructions. Patient expresses understanding.   The above documentation has been reviewed and is accurate and complete Clementeen Graham, M.D.

## 2020-12-22 NOTE — Therapy (Signed)
[facility] [address] [place], [place], [address] Phone: [phone]   Fax:  [phone]  Physical Therapy Treatment  Patient Details  Name: Shari Small MRN: [id number] Date of Birth: 14-Nov-1985 Referring Provider (PT): Cheron Every Date: 12/22/2020   PT End of Session - 12/22/20 1822     Visit Number 5    Date for PT Re-Evaluation 01/22/21    PT Start Time 1815    PT Stop Time 1855    PT Time Calculation (min) 40 min    Activity Tolerance Patient tolerated treatment well    Behavior During Therapy [facility] for tasks assessed/performed             Past Medical History:  Diagnosis Date   Heterotopic ossification 02/2013   right thigh    Past Surgical History:  Procedure Laterality Date   INCISION AND DRAINAGE HIP Right 03/19/2013   RIGHT THIGH EXCISION  HETEROTOPIC OSSIFICATIONS;  Surgeon: Loreta Ave, MD;  Location: [facility];  Service: Orthopedics;  Laterality: Right;   WISDOM TOOTH EXTRACTION  2004    There were no vitals filed for this visit.   Subjective Assessment - 12/22/20 1819     Subjective Reports not too much of a change after DN. Pt reports she hasnt had any numbness into the arm since not sleeping on side. Reports saw Dr Denyse Amass and they would like to order MRI for the neck given slow progress with PT. Also prescirbed ms relaxers to try    Currently in Pain? Yes    Pain Score 2     Pain Location Neck    Pain Orientation Right                               [facility] Adult PT Treatment/Exercise - 12/22/20 0001       Neck Exercises: Machines for Strengthening   UBE (Upper Arm Bike) L2 x 2 min each    Nustep L5 x 5 min    Cybex Row 25# 2x10    Lat Pull 25# 2x10    Other Machines for Strengthening standing shoulder ext 10# 2x10    Other Machines for Strengthening standing rows 2x10 15#      Neck Exercises: Standing   Other Standing Exercises shoulder  flex/ext/IR with 3# weight bar x10 3 sec hold    Other Standing Exercises red scap stab 3 ways 2 x5 B; shoulder flex/abd 3# 2x10      Neck Exercises: Stretches   Other Neck Stretches Scalene stretch  3 way 20" x 3 each                      PT Short Term Goals - 11/29/20 1901       PT SHORT TERM GOAL #1   Title Pt will be I with initial HEP    Time 2    Period Weeks    Status Achieved    Target Date 12/06/20               PT Long Term Goals - 11/22/20 1827       PT LONG TERM GOAL #1   Title Pt will be I with advanced HEP    Time 6    Period Weeks    Status New    Target Date 01/03/21      PT LONG TERM  GOAL #2   Title Pt will report 50% reduction in R sided cervical/UT pain    Time 6    Period Weeks    Status New    Target Date 01/03/21      PT LONG TERM GOAL #3   Title Pt will demo understanding of posture/body mechanics    Time 6    Period Weeks    Status New    Target Date 01/03/21      PT LONG TERM GOAL #4   Title Pt will demo FOTO improved to 78%    Baseline 68%; 60% risk adjusted    Time 6    Period Weeks    Status New    Target Date 01/03/21                   Plan - 12/22/20 1836     Clinical Impression Statement Pt reports MRI to be scheduled and wil be starting mms relaxers as well with neck. She described knot in the scalenes region of the right side of the neck, we trialed 3 way scalene stretch which she reported felt nice, and we also revieiwed a scalene stretch for home to add to HEP.    PT Treatment/Interventions ADLs/Self Care Home Management;Electrical Stimulation;Iontophoresis 4mg /ml Dexamethasone;Moist Heat;Therapeutic exercise;Therapeutic activities;Patient/family education;Manual techniques;Dry needling;Passive range of motion;Taping    PT Next Visit Plan Follow up regarding MRI and regarding tolerance to scalene stretches. scap stab/postural stability, cervical/thoracic flexibility, manual/modalities as indicated     PT Home Exercise Plan anterior scalene/SCM stretch added to HEP    Consulted and Agree with Plan of Care Patient             Patient will benefit from skilled therapeutic intervention in order to improve the following deficits and impairments:  Increased muscle spasms, Pain, Impaired flexibility, Improper body mechanics, Decreased strength, Postural dysfunction  Visit Diagnosis: Cervicalgia  Cramp and spasm  Abnormal posture  Muscle weakness (generalized)     Problem List Patient Active Problem List   Diagnosis Date Noted   Annual physical exam 08/06/2019   Obesity (BMI 30-39.9) 08/06/2019   PCP NOTES >>>>>>>>>>>>>>>>>>>>>> 08/06/2019   Family history of breast cancer in sister 08/04/2019   Family history of colon cancer in sister 08/04/2019   Eustachian tube dysfunction, bilateral 05/13/2018   Right leg pain 03/12/2013    03/14/2013, PT, DPT 12/22/2020, 7:02 PM  [facility] [place] [facility] [address] [place], [place], [place] Phone: [phone]   Fax:  [phone]  Name: Shari Small MRN: [name] Date of Birth: 1986/04/11

## 2020-12-22 NOTE — Patient Instructions (Signed)
Thank you for coming in today.   Lets try the tizanidine muscle relaxer.   If not doing ok let me know and I will order the imaging test to best look at this area (probably CT scan of the neck and maybe also chest).  Let me know.

## 2020-12-27 ENCOUNTER — Ambulatory Visit: Payer: BC Managed Care – PPO

## 2020-12-28 ENCOUNTER — Ambulatory Visit: Payer: BC Managed Care – PPO | Admitting: Physical Therapy

## 2021-01-02 ENCOUNTER — Telehealth: Payer: Self-pay

## 2021-01-02 ENCOUNTER — Ambulatory Visit: Payer: BC Managed Care – PPO | Admitting: Physical Therapy

## 2021-01-02 ENCOUNTER — Encounter: Payer: Self-pay | Admitting: Physical Therapy

## 2021-01-02 ENCOUNTER — Other Ambulatory Visit: Payer: Self-pay

## 2021-01-02 DIAGNOSIS — M6281 Muscle weakness (generalized): Secondary | ICD-10-CM

## 2021-01-02 DIAGNOSIS — R222 Localized swelling, mass and lump, trunk: Secondary | ICD-10-CM

## 2021-01-02 DIAGNOSIS — R252 Cramp and spasm: Secondary | ICD-10-CM

## 2021-01-02 DIAGNOSIS — M542 Cervicalgia: Secondary | ICD-10-CM | POA: Diagnosis not present

## 2021-01-02 DIAGNOSIS — R293 Abnormal posture: Secondary | ICD-10-CM

## 2021-01-02 NOTE — Telephone Encounter (Signed)
Clavicle pain worse the pain is radiating to the chest and was told to contact office if worse to get MRI/MRI's ordered.

## 2021-01-02 NOTE — Therapy (Signed)
[facility] [address] [place], [place], [address] Phone: [phone]   Fax:  [phone]  Physical Therapy Treatment  Patient Details  Name: Shari Small MRN: [id number] Date of Birth: 01/08/1986 Referring Provider (PT): Cheron Every Date: 01/02/2021   PT End of Session - 01/02/21 1743     Visit Number 6    Date for PT Re-Evaluation 01/22/21    PT Start Time 1728    PT Stop Time 1745    PT Time Calculation (min) 17 min    Activity Tolerance Patient limited by pain    Behavior During Therapy [employer] for tasks assessed/performed             Past Medical History:  Diagnosis Date   Heterotopic ossification 02/2013   right thigh    Past Surgical History:  Procedure Laterality Date   INCISION AND DRAINAGE HIP Right 03/19/2013   RIGHT THIGH EXCISION  HETEROTOPIC OSSIFICATIONS;  Surgeon: Loreta Ave, MD;  Location: [facility];  Service: Orthopedics;  Laterality: Right;   WISDOM TOOTH EXTRACTION  2004    There were no vitals filed for this visit.   Subjective Assessment - 01/02/21 1740     Subjective Pt presents to clinic reporting significant increase in R sided cervical pain over the weekend. Pushed up from the table to stand and experienced severe supraclavicular pain radiating down. Almost went to the ER d/t severity of pain. Feeling better now but still tender and guarded. Denies N/T, radiating pain into UE, chest pain, or SOB.    Currently in Pain? Yes    Pain Score 4     Pain Location Neck    Pain Orientation Right                [facility] PT Assessment - 01/02/21 0001       Palpation   Palpation comment ttp R UT; no palpable excess movement of proximal or distal clavicle                           [facility] Adult PT Treatment/Exercise - 01/02/21 0001       Modalities   Modalities Iontophoresis      Moist Heat Therapy   Number Minutes Moist Heat 8 Minutes     Moist Heat Location Cervical      Iontophoresis   Type of Iontophoresis Dexamethasone    Location R UT    Dose 80 mA    Time 4 hr patch                      PT Short Term Goals - 11/29/20 1901       PT SHORT TERM GOAL #1   Title Pt will be I with initial HEP    Time 2    Period Weeks    Status Achieved    Target Date 12/06/20               PT Long Term Goals - 11/22/20 1827       PT LONG TERM GOAL #1   Title Pt will be I with advanced HEP    Time 6    Period Weeks    Status New    Target Date 01/03/21      PT LONG TERM GOAL #2   Title Pt will report 50% reduction in R sided cervical/UT pain  Time 6    Period Weeks    Status New    Target Date 01/03/21      PT LONG TERM GOAL #3   Title Pt will demo understanding of posture/body mechanics    Time 6    Period Weeks    Status New    Target Date 01/03/21      PT LONG TERM GOAL #4   Title Pt will demo FOTO improved to 78%    Baseline 68%; 60% risk adjusted    Time 6    Period Weeks    Status New    Target Date 01/03/21                   Plan - 01/02/21 1743     Clinical Impression Statement Pt presents to clinic reporting increase in R supraclavicular pain over the weekend and increased guarding/pain today. Per pt request, kept today's visit to education and application of iontophoresis for anti-inflammatory. Pt neg for any red flag symptoms but hesitant to participate in TE today because does not want to flare symptoms again. Pt will be scheduling MRI soon; potential to put on hold next rx until after MRI with updated HEP.    PT Treatment/Interventions ADLs/Self Care Home Management;Electrical Stimulation;Iontophoresis 4mg /ml Dexamethasone;Moist Heat;Therapeutic exercise;Therapeutic activities;Patient/family education;Manual techniques;Dry needling;Passive range of motion;Taping    PT Next Visit Plan Follow up regarding MRI and regarding tolerance to scalene stretches. scap  stab/postural stability, cervical/thoracic flexibility, manual/modalities as indicated    Consulted and Agree with Plan of Care Patient             Patient will benefit from skilled therapeutic intervention in order to improve the following deficits and impairments:  Increased muscle spasms, Pain, Impaired flexibility, Improper body mechanics, Decreased strength, Postural dysfunction  Visit Diagnosis: Cervicalgia  Cramp and spasm  Abnormal posture  Muscle weakness (generalized)     Problem List Patient Active Problem List   Diagnosis Date Noted   Annual physical exam 08/06/2019   Obesity (BMI 30-39.9) 08/06/2019   PCP NOTES >>>>>>>>>>>>>>>>>>>>>> 08/06/2019   Family history of breast cancer in sister 08/04/2019   Family history of colon cancer in sister 08/04/2019   Eustachian tube dysfunction, bilateral 05/13/2018   Right leg pain 03/12/2013   03/14/2013, PT, DPT Lysle Rubens Imanol Bihl 01/02/2021, 5:46 PM  [facility] [address] [place], [place], [place] Phone: [phone]   Fax:  [phone]  Name: Shari Small MRN: [name] Date of Birth: [birth year]

## 2021-01-03 NOTE — Telephone Encounter (Signed)
I spoke with radiology and the best test to evaluate the swelling and to truly rule out any potential swollen lymph nodes or tumors would be a CT scan of the neck and chest with contrast.  I have ordered this test.  You should hear soon about scheduling.

## 2021-01-04 ENCOUNTER — Telehealth: Payer: Self-pay | Admitting: Family Medicine

## 2021-01-04 ENCOUNTER — Ambulatory Visit: Payer: BC Managed Care – PPO | Admitting: Physical Therapy

## 2021-01-04 NOTE — Telephone Encounter (Signed)
Patient called stating that Dr Denyse Amass ordered a CT Scan for her and after speaking to her insurance company, they would like this done at:  [facility] [address] [place]. [employer]  Phone: [phone] Fax: [phone]

## 2021-01-04 NOTE — Telephone Encounter (Signed)
Left pt a messaging conveying this information.

## 2021-01-04 NOTE — Telephone Encounter (Signed)
Done and faxed

## 2021-01-13 DIAGNOSIS — Z20822 Contact with and (suspected) exposure to covid-19: Secondary | ICD-10-CM | POA: Diagnosis not present

## 2021-01-20 ENCOUNTER — Telehealth: Payer: Self-pay | Admitting: Family Medicine

## 2021-01-20 DIAGNOSIS — R221 Localized swelling, mass and lump, neck: Secondary | ICD-10-CM

## 2021-01-20 NOTE — Telephone Encounter (Signed)
Called patient to review CT scan of chest and neck with contrast.  CT was done at [facility] location and should be available in Care Everywhere. Results available via fax and will be scanned into chart.  CT chest shows focal groundglass opacification consistent with atypical infection.  Patient mentions that she was pretty sure she had COVID around the time of the CT scan but is feeling better now.  CT scan neck shows no masses in the supraclavicular lesion but does show an incidental structure next to the thyroid gland that radiology would like a ultrasound to evaluate further with Doppler flow.  She has the insurance at [facility] so we will do the ultrasound at the [facility] location.  Recommend patient get the images of the CT scan on a CD and bring it with her at the follow-up appointment after the ultrasound is done.

## 2021-02-07 DIAGNOSIS — R221 Localized swelling, mass and lump, neck: Secondary | ICD-10-CM | POA: Diagnosis not present

## 2021-02-09 ENCOUNTER — Telehealth: Payer: Self-pay | Admitting: Family Medicine

## 2021-02-09 NOTE — Telephone Encounter (Signed)
Ultrasound result of the neck came back normal.  The cyst seen on CT scan was thought to a collapsed varix which is nothing to be concerned about.  Attached is the [place] report.   INDICATION:  \ R22.1 Neck mass  COMPARISON: CT neck 01/04/2021   TECHNIQUE: Real-time, multiplanar grayscale ultrasound was performed at the indicated area of concern, supplemented by color Doppler as needed.   FINDINGS:   Targeted interrogation of the midline and left neck demonstrates normal thyroid tissue. No specific finding is made which matches the structure along the lateral wall of the left hypopharynx described on CT 01/12/2021. Of note, this area is difficult to assess because of shadowing from the hyoid bone.   CONCLUSION:    No specific sonographic correlate is identified for the structure along the lateral wall of the left hypopharynx described on CT 01/12/2021. On retrospective review, this is favored to represent a (collapsed) varix. Exam End: 02/07/21 08:49   Specimen Collected: 02/07/21 14:19 Last Resulted: 02/07/21 16:45

## 2021-02-10 NOTE — Telephone Encounter (Signed)
Pt returned VM and was provided results. Pt verbalized understanding. Pt is going to keep an eye on her pain and possibly schedule a f/u within the next 1-2 weeks.

## 2021-02-10 NOTE — Telephone Encounter (Signed)
Called pt and LM for her to either return call to office to go over her neck US results or to log into MyChart to view those results.

## 2021-08-17 ENCOUNTER — Encounter: Payer: Self-pay | Admitting: Internal Medicine

## 2021-10-05 ENCOUNTER — Telehealth: Payer: Self-pay | Admitting: Internal Medicine

## 2021-10-05 DIAGNOSIS — Z800 Family history of malignant neoplasm of digestive organs: Secondary | ICD-10-CM

## 2021-10-05 NOTE — Telephone Encounter (Signed)
Pt's sister w/ colon cancer, referral placed to Dr. Carlynn Herald in [place].  ?

## 2021-10-05 NOTE — Telephone Encounter (Signed)
Pt states she is overdue for a colonoscopy and would like to get it done at [facility] with Dr.Green.   ?

## 2021-10-27 DIAGNOSIS — Z01419 Encounter for gynecological examination (general) (routine) without abnormal findings: Secondary | ICD-10-CM | POA: Diagnosis not present

## 2021-10-27 DIAGNOSIS — Z803 Family history of malignant neoplasm of breast: Secondary | ICD-10-CM | POA: Diagnosis not present

## 2021-10-27 DIAGNOSIS — Z6839 Body mass index (BMI) 39.0-39.9, adult: Secondary | ICD-10-CM | POA: Diagnosis not present

## 2021-10-27 DIAGNOSIS — Z8371 Family history of colonic polyps: Secondary | ICD-10-CM | POA: Diagnosis not present

## 2021-10-27 DIAGNOSIS — Z124 Encounter for screening for malignant neoplasm of cervix: Secondary | ICD-10-CM | POA: Diagnosis not present

## 2021-10-27 DIAGNOSIS — Z800 Family history of malignant neoplasm of digestive organs: Secondary | ICD-10-CM | POA: Diagnosis not present

## 2021-10-27 LAB — HM PAP SMEAR: HM Pap smear: NEGATIVE

## 2021-11-09 DIAGNOSIS — Z1239 Encounter for other screening for malignant neoplasm of breast: Secondary | ICD-10-CM | POA: Diagnosis not present

## 2021-11-14 ENCOUNTER — Other Ambulatory Visit: Payer: Self-pay | Admitting: Obstetrics and Gynecology

## 2021-11-14 DIAGNOSIS — R928 Other abnormal and inconclusive findings on diagnostic imaging of breast: Secondary | ICD-10-CM

## 2021-11-16 ENCOUNTER — Encounter: Payer: Self-pay | Admitting: Internal Medicine

## 2021-11-16 ENCOUNTER — Telehealth: Payer: Self-pay

## 2021-11-16 ENCOUNTER — Ambulatory Visit (INDEPENDENT_AMBULATORY_CARE_PROVIDER_SITE_OTHER): Payer: BC Managed Care – PPO | Admitting: Internal Medicine

## 2021-11-16 VITALS — BP 126/84 | HR 52 | Temp 98.4°F | Resp 16 | Ht 66.0 in | Wt 250.0 lb

## 2021-11-16 DIAGNOSIS — R351 Nocturia: Secondary | ICD-10-CM

## 2021-11-16 DIAGNOSIS — R079 Chest pain, unspecified: Secondary | ICD-10-CM

## 2021-11-16 DIAGNOSIS — G44209 Tension-type headache, unspecified, not intractable: Secondary | ICD-10-CM

## 2021-11-16 DIAGNOSIS — F439 Reaction to severe stress, unspecified: Secondary | ICD-10-CM

## 2021-11-16 DIAGNOSIS — R399 Unspecified symptoms and signs involving the genitourinary system: Secondary | ICD-10-CM | POA: Diagnosis not present

## 2021-11-16 LAB — URINALYSIS, ROUTINE W REFLEX MICROSCOPIC
Bilirubin Urine: NEGATIVE
Ketones, ur: NEGATIVE
Leukocytes,Ua: NEGATIVE
Nitrite: NEGATIVE
Specific Gravity, Urine: 1.025 (ref 1.000–1.030)
Total Protein, Urine: NEGATIVE
Urine Glucose: NEGATIVE
Urobilinogen, UA: 0.2 (ref 0.0–1.0)
pH: 5.5 (ref 5.0–8.0)

## 2021-11-16 LAB — SEDIMENTATION RATE: Sed Rate: 34 mm/hr — ABNORMAL HIGH (ref 0–20)

## 2021-11-16 LAB — CBC WITH DIFFERENTIAL/PLATELET
Basophils Absolute: 0 10*3/uL (ref 0.0–0.1)
Basophils Relative: 0.6 % (ref 0.0–3.0)
Eosinophils Absolute: 0.2 10*3/uL (ref 0.0–0.7)
Eosinophils Relative: 2.4 % (ref 0.0–5.0)
HCT: 41.9 % (ref 36.0–46.0)
Hemoglobin: 13.7 g/dL (ref 12.0–15.0)
Lymphocytes Relative: 29 % (ref 12.0–46.0)
Lymphs Abs: 2 10*3/uL (ref 0.7–4.0)
MCHC: 32.8 g/dL (ref 30.0–36.0)
MCV: 88.8 fl (ref 78.0–100.0)
Monocytes Absolute: 0.5 10*3/uL (ref 0.1–1.0)
Monocytes Relative: 7.5 % (ref 3.0–12.0)
Neutro Abs: 4.1 10*3/uL (ref 1.4–7.7)
Neutrophils Relative %: 60.5 % (ref 43.0–77.0)
Platelets: 266 10*3/uL (ref 150.0–400.0)
RBC: 4.72 Mil/uL (ref 3.87–5.11)
RDW: 14 % (ref 11.5–15.5)
WBC: 6.8 10*3/uL (ref 4.0–10.5)

## 2021-11-16 LAB — HEMOGLOBIN A1C: Hgb A1c MFr Bld: 5.2 % (ref 4.6–6.5)

## 2021-11-16 LAB — COMPREHENSIVE METABOLIC PANEL
ALT: 8 U/L (ref 0–35)
AST: 14 U/L (ref 0–37)
Albumin: 4.1 g/dL (ref 3.5–5.2)
Alkaline Phosphatase: 51 U/L (ref 39–117)
BUN: 12 mg/dL (ref 6–23)
CO2: 24 mEq/L (ref 19–32)
Calcium: 8.8 mg/dL (ref 8.4–10.5)
Chloride: 105 mEq/L (ref 96–112)
Creatinine, Ser: 0.87 mg/dL (ref 0.40–1.20)
GFR: 85.34 mL/min (ref >60.00)
Glucose, Bld: 80 mg/dL (ref 70–99)
Potassium: 4 mEq/L (ref 3.5–5.1)
Sodium: 138 mEq/L (ref 135–145)
Total Bilirubin: 0.5 mg/dL (ref 0.2–1.2)
Total Protein: 6.7 g/dL (ref 6.0–8.3)

## 2021-11-16 MED ORDER — PANTOPRAZOLE SODIUM 40 MG PO TBEC: 40.0000 mg | DELAYED_RELEASE_TABLET | Freq: Every day | ORAL | 0 refills | Status: AC

## 2021-11-16 NOTE — Assessment & Plan Note (Signed)
Presents with multiple symptoms: Nocturia: Obtain CMP, UA, urine culture, A1c. Bilateral temple pain as described above, description does not fit a migraine necessarily, perhaps a tensional headache.  No evidence of TMJ pathology.  We will get a sed rate and CBC. Nonexertional chest pressure: Atypical for a coronary syndrome, reports history of mild GERD but sxs are at baseline. EKG today: NSR.  Consider a Holter monitor versus further eval. Anxiety, stress: Overall feels  well, sees a counselor, she still could have underlying anxiety with somatization.  We talk about possible medication and she will think about it. Morbid obesity: Checking A1c ER if any of above sxs increase  RTC 4-5 for reassessment and  CPX

## 2021-11-16 NOTE — Patient Instructions (Addendum)
Start pantoprazole 40 mg 1 tablet before breakfast  If any of your symptoms get severe or much worse: Seek medical attention GO TO THE LAB : Get the blood work     [place] back for a physical exam in 4 to 5 weeks

## 2021-11-16 NOTE — Telephone Encounter (Signed)
PA initiated via Covermymeds; KEY: [id number]. PA approved.   Effective from 11/16/2021 through 11/15/2022.

## 2021-11-16 NOTE — Progress Notes (Signed)
Subjective:    Patient ID: Shari Small, female    DOB: 1986-05-30, 36 y.o.   MRN: [id number]  DOS:  11/16/2021 Type of visit - description: acute  Was here for CPX however has multiple symptoms.  For the last 6 weeks has noted nocturia, up to 5 times at night. No dysuria or gross hematuria Denies any lower extremity edema Denies being excessively thirsty, no blurred vision.  She was under extreme stress last year particularly in November and December 2022, her father had dementia and eventually passed.  Since then, she is experiencing several symptoms:  Bilateral temple pain described as pressure, happens daily anywhere from 1-2 times to 15 times. Pain lasts seconds, not described as generalized headaches, no associated nausea vomiting.  No aura, no visual disturbances.  No jaw claudication.  Also report episodes of chest pressures, in the middle of the chest, happens as frequency is 15 times a day. Last seconds No associated nausea, diaphoresis.  No exertional symptoms Not triggered by food or exercise but typically by stress. Mild palpitations?.  No LOC.  As far as stress/anxiety, she talks with a counselor, and in general she feels better but symptoms as described above continue   Review of Systems See above   Past Medical History:  Diagnosis Date   Heterotopic ossification 02/2013   right thigh    Past Surgical History:  Procedure Laterality Date   INCISION AND DRAINAGE HIP Right 03/19/2013   RIGHT THIGH EXCISION  HETEROTOPIC OSSIFICATIONS;  Surgeon: Loreta Ave, MD;  Location: [facility];  Service: Orthopedics;  Laterality: Right;   WISDOM TOOTH EXTRACTION  2004    Current Outpatient Medications  Medication Instructions   tiZANidine (ZANAFLEX) 4 mg, Oral, Every 8 hours PRN       Objective:   Physical Exam BP 126/84   Pulse (!) 52   Temp 98.4 F (36.9 C) (Oral)   Resp 16   Ht 5\' 6"  (1.676 m)   Wt 250 lb (113.4 kg)   LMP 11/15/2021  (Exact Date)   SpO2 96%   BMI 40.35 kg/m  General:   Well developed, NAD, BMI noted.  HEENT:  Normocephalic . Face symmetric, atraumatic. Temples: + Palpable temporary arteries with no pain. TMJ: No click, nontender EOMI Lungs:  CTA B Normal respiratory effort, no intercostal retractions, no accessory muscle use. Heart: RRR,  no murmur.  Abdomen:  Not distended, soft, non-tender. No rebound or rigidity.   Skin: Not pale. Not jaundice Lower extremities: no pretibial edema bilaterally  Neurologic:  alert & oriented X3.  Speech normal, gait appropriate for age and unassisted Psych--  Cognition and judgment appear intact.  Cooperative with normal attention span and concentration.  Behavior appropriate. No anxious or depressed appearing.     Assessment    ASSESSMENT    (New patient 07/2019, referred by her sister) Heterotopic ossification (s/p  surgery for removal of calcification around the hip due to a hematoma) Parotid gland stones/parotitis Obesity Family history: -Sister has colon cancer age 1and breast cancer age 88   -GF: colon ca age 32  BC: abstinence as of 11/2018   PLAN  Presents with multiple symptoms: Nocturia: Obtain CMP, UA, urine culture, A1c. Bilateral temple pain as described above, description does not fit a migraine necessarily, perhaps a tensional headache.  No evidence of TMJ pathology.  We will get a sed rate and CBC. Nonexertional chest pressure: Atypical for a coronary syndrome, reports history of mild GERD but  sxs are at baseline. EKG today: NSR.  Consider a Holter monitor versus further eval. Anxiety, stress: Overall feels  well, sees a counselor, she still could have underlying anxiety with somatization.  We talk about possible medication and she will think about it. Morbid obesity: Checking A1c ER if any of above sxs increase  RTC 4-5 for reassessment and  CPX

## 2021-11-17 LAB — URINE CULTURE
MICRO NUMBER:: 13470396
SPECIMEN QUALITY:: ADEQUATE

## 2021-11-29 ENCOUNTER — Ambulatory Visit
Admission: RE | Admit: 2021-11-29 | Discharge: 2021-11-29 | Disposition: A | Discharge status: Home / Self Care | Payer: Self-pay | Source: Ambulatory Visit | Attending: Obstetrics and Gynecology | Admitting: Obstetrics and Gynecology

## 2021-11-29 ENCOUNTER — Other Ambulatory Visit: Payer: Self-pay | Admitting: Obstetrics and Gynecology

## 2021-11-29 DIAGNOSIS — R928 Other abnormal and inconclusive findings on diagnostic imaging of breast: Secondary | ICD-10-CM

## 2021-11-29 DIAGNOSIS — Z803 Family history of malignant neoplasm of breast: Secondary | ICD-10-CM | POA: Diagnosis not present

## 2021-11-29 DIAGNOSIS — N6011 Diffuse cystic mastopathy of right breast: Secondary | ICD-10-CM | POA: Diagnosis not present

## 2021-11-29 DIAGNOSIS — N6012 Diffuse cystic mastopathy of left breast: Secondary | ICD-10-CM | POA: Diagnosis not present

## 2021-11-29 DIAGNOSIS — N6489 Other specified disorders of breast: Secondary | ICD-10-CM

## 2021-11-29 LAB — HM MAMMOGRAPHY

## 2021-12-01 ENCOUNTER — Encounter: Payer: Self-pay | Admitting: Internal Medicine

## 2021-12-07 DIAGNOSIS — Z9189 Other specified personal risk factors, not elsewhere classified: Secondary | ICD-10-CM | POA: Diagnosis not present

## 2021-12-20 ENCOUNTER — Encounter: Payer: Self-pay | Admitting: Internal Medicine

## 2021-12-20 ENCOUNTER — Ambulatory Visit (INDEPENDENT_AMBULATORY_CARE_PROVIDER_SITE_OTHER): Payer: BC Managed Care – PPO | Admitting: Internal Medicine

## 2021-12-20 VITALS — BP 110/62 | HR 72 | Temp 98.2°F | Resp 18 | Ht 67.0 in | Wt 246.0 lb

## 2021-12-20 DIAGNOSIS — Z0000 Encounter for general adult medical examination without abnormal findings: Secondary | ICD-10-CM

## 2021-12-20 DIAGNOSIS — Z23 Encounter for immunization: Secondary | ICD-10-CM

## 2021-12-20 DIAGNOSIS — Z1159 Encounter for screening for other viral diseases: Secondary | ICD-10-CM | POA: Diagnosis not present

## 2021-12-20 NOTE — Patient Instructions (Signed)
Vaccines I recommend: Another HPV vaccine in 4 to 6 weeks, please make an appointment.  After that your series are completed Flu shot every fall A COVID booster this year     GO TO THE LAB : Get the blood work     GO TO THE FRONT DESK, PLEASE SCHEDULE YOUR APPOINTMENTS Come back for a physical exam in 1 year

## 2021-12-20 NOTE — Progress Notes (Signed)
Subjective:    Patient ID: Shari Small, female    DOB: Apr 10, 1986, 36 y.o.   MRN: [id number]  DOS:  12/20/2021 Type of visit - description: cpx  Here for CPX. See last visit, had a number of concerns: Nocturia has significantly decreased, she is drinking less fluids at night. Chest pain is still there, maybe 1 episode a week, last few seconds.   Wt Readings from Last 3 Encounters:  12/20/21 246 lb (111.6 kg)  11/16/21 250 lb (113.4 kg)  12/22/20 248 lb 9.6 oz (112.8 kg)     Review of Systems  Other than above, a 14 point review of systems is negative    Past Medical History:  Diagnosis Date   Heterotopic ossification 02/2013   right thigh    Past Surgical History:  Procedure Laterality Date   INCISION AND DRAINAGE HIP Right 03/19/2013   RIGHT THIGH EXCISION  HETEROTOPIC OSSIFICATIONS;  Surgeon: Loreta Ave, MD;  Location: [facility];  Service: Orthopedics;  Laterality: Right;   WISDOM TOOTH EXTRACTION  2004   Social History   Socioeconomic History   Marital status: Single    Spouse name: Not on file   Number of children: 0   Years of education: Not on file   Highest education level: Not on file  Occupational History   Occupation: part time     Employer: [employer]   Occupation: works at [employer] , psychology-sociology  Tobacco Use   Smoking status: Former    Years: 8.00    Types: Cigarettes    Quit date: 03/03/2013    Years since quitting: 8.8   Smokeless tobacco: Never  Vaping Use   Vaping Use: Never used  Substance and Sexual Activity   Alcohol use: Not Currently   Drug use: No   Sexual activity: Not Currently  Other Topics Concern   Not on file  Social History Narrative   Regular exercise: 3-4 x a week; treadmill and pilates   Caffeine use: occasionally   Social Determinants of Health   Financial Resource Strain: Not on file  Food Insecurity: Not on file  Transportation Needs: Not on file  Physical  Activity: Not on file  Stress: Not on file  Social Connections: Not on file  Intimate Partner Violence: Not on file     Current Outpatient Medications  Medication Instructions   pantoprazole (PROTONIX) 40 mg, Oral, Daily before breakfast   tiZANidine (ZANAFLEX) 4 mg, Oral, Every 8 hours PRN       Objective:   Physical Exam BP 110/62 (BP Location: Left Arm, Patient Position: Sitting, Cuff Size: Large)   Pulse 72   Temp 98.2 F (36.8 C) (Oral)   Resp 18   Ht 5\' 7"  (1.702 m)   Wt 246 lb (111.6 kg)   SpO2 98%   BMI 38.53 kg/m  General: Well developed, NAD, BMI noted Neck: No  thyromegaly  HEENT:  Normocephalic . Face symmetric, atraumatic Lungs:  CTA B Normal respiratory effort, no intercostal retractions, no accessory muscle use. Heart: RRR,  no murmur.  Abdomen:  Not distended, soft, non-tender. No rebound or rigidity.   Lower extremities: no pretibial edema bilaterally  Skin: Exposed areas without rash. Not pale. Not jaundice Neurologic:  alert & oriented X3.  Speech normal, gait appropriate for age and unassisted Strength symmetric and appropriate for age.  Psych: Cognition and judgment appear intact.  Cooperative with normal attention span and concentration.  Behavior appropriate. No anxious  or depressed appearing.     Assessment    ASSESSMENT    (New patient 07/2019, referred by her sister) Heterotopic ossification (s/p  surgery for removal of calcification around the hip due to a hematoma) Parotid gland stones/parotitis Obesity Family history: -Sister has colon cancer age 46and breast cancer age 55   -GF: colon ca age 40  BC: abstinence as of 11/2018  PLAN  Here for CPX See last visit: Nocturia: Improved Nonexertional chest pain: Still there, symptoms not consistent with coronary disease, she is somewhat concerned, we agreed on observation for now.  If worse symptoms she will let me know.  Further eval?. Morbid obesity: Recommend to lose 5 to 10% of  her current weight.  Extensive conversation about the issue. RTC 1 year

## 2021-12-21 ENCOUNTER — Encounter: Payer: Self-pay | Admitting: Internal Medicine

## 2021-12-21 LAB — LIPID PANEL
Cholesterol: 187 mg/dL (ref 0–200)
HDL: 47.7 mg/dL (ref >39.00)
LDL Cholesterol: 122 mg/dL — ABNORMAL HIGH (ref 0–99)
NonHDL: 139.39
Total CHOL/HDL Ratio: 4
Triglycerides: 86 mg/dL (ref 0.0–149.0)
VLDL: 17.2 mg/dL (ref 0.0–40.0)

## 2021-12-21 LAB — HEPATITIS C ANTIBODY: Hepatitis C Ab: NONREACTIVE

## 2021-12-21 LAB — TSH: TSH: 0.72 u[IU]/mL (ref 0.35–5.50)

## 2021-12-21 NOTE — Assessment & Plan Note (Signed)
Here for CPX See last visit: Nocturia: Improved Nonexertional chest pain: Still there, symptoms not consistent with coronary disease, she is somewhat concerned, we agreed on observation for now.  If worse symptoms she will let me know.  Further eval?. Morbid obesity: Recommend to lose 5 to 10% of her current weight.  Extensive conversation about the issue. RTC 1 year

## 2021-12-21 NOTE — Assessment & Plan Note (Signed)
-   Td 2019 - Covid vax booster rec - had a rabies vax booster (works part-time at [employer]) -HPV x1, recommend 2 additional doses: one provided today, next in 1 month. - flu shot q fall - +FH Colon cancer sister, scheduled a  colonoscopy for 03-2022 -Female care, saw gyn Dr Rana Snare, had a MMG 11-2021, next in 6 m -Recent labs reviewed.  We will check a FLP, TSH and hep C -Diet exercise discussed

## 2022-01-17 ENCOUNTER — Ambulatory Visit: Payer: BC Managed Care – PPO

## 2022-01-18 ENCOUNTER — Ambulatory Visit (INDEPENDENT_AMBULATORY_CARE_PROVIDER_SITE_OTHER): Payer: BC Managed Care – PPO

## 2022-01-18 DIAGNOSIS — Z23 Encounter for immunization: Secondary | ICD-10-CM

## 2022-01-18 NOTE — Progress Notes (Signed)
Shari Small is a 36 y.o. female presents to the office today for 3rd HPV injection, per physician's orders. Original order: 12/20/21 HPV- 9  valent vaccine 0.5 mL IM was administered L deltoid today. Patient tolerated injection. Patient due for follow up labs/provider appt: No.

## 2022-03-27 ENCOUNTER — Encounter: Payer: Self-pay | Admitting: Internal Medicine

## 2022-03-27 DIAGNOSIS — Z1211 Encounter for screening for malignant neoplasm of colon: Secondary | ICD-10-CM | POA: Diagnosis not present

## 2022-03-27 DIAGNOSIS — Z800 Family history of malignant neoplasm of digestive organs: Secondary | ICD-10-CM | POA: Diagnosis not present

## 2022-03-27 DIAGNOSIS — K219 Gastro-esophageal reflux disease without esophagitis: Secondary | ICD-10-CM | POA: Diagnosis not present

## 2022-03-27 LAB — HM COLONOSCOPY

## 2022-06-05 ENCOUNTER — Ambulatory Visit
Admission: RE | Admit: 2022-06-05 | Discharge: 2022-06-05 | Disposition: A | Discharge status: Home / Self Care | Payer: BC Managed Care – PPO | Source: Ambulatory Visit | Attending: Obstetrics and Gynecology | Admitting: Obstetrics and Gynecology

## 2022-06-05 ENCOUNTER — Other Ambulatory Visit: Payer: Self-pay | Admitting: Obstetrics and Gynecology

## 2022-06-05 ENCOUNTER — Ambulatory Visit: Payer: BC Managed Care – PPO

## 2022-06-05 DIAGNOSIS — N6489 Other specified disorders of breast: Secondary | ICD-10-CM

## 2022-06-05 DIAGNOSIS — R928 Other abnormal and inconclusive findings on diagnostic imaging of breast: Secondary | ICD-10-CM | POA: Diagnosis not present

## 2022-06-26 IMAGING — MG DIGITAL DIAGNOSTIC BILAT W/ TOMO W/ CAD
6 of 12 series · 6 of 36 positions shown · non-contrast
Comparison: Previous exam(s).

CLINICAL DATA: Screening recall for a possible right breast mass
and 2 left breast asymmetries. The patient has family history of
breast cancer in her sister and her maternal aunt.



[L ML synth-2D]
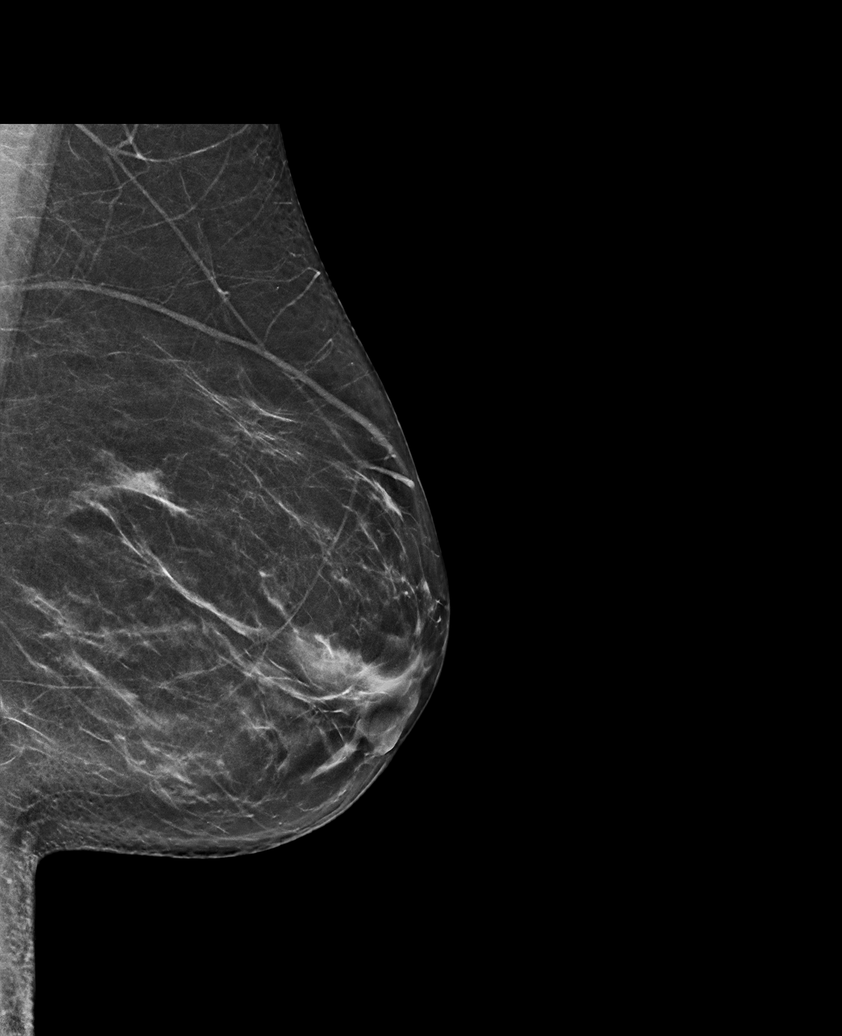

[R ML synth-2D]
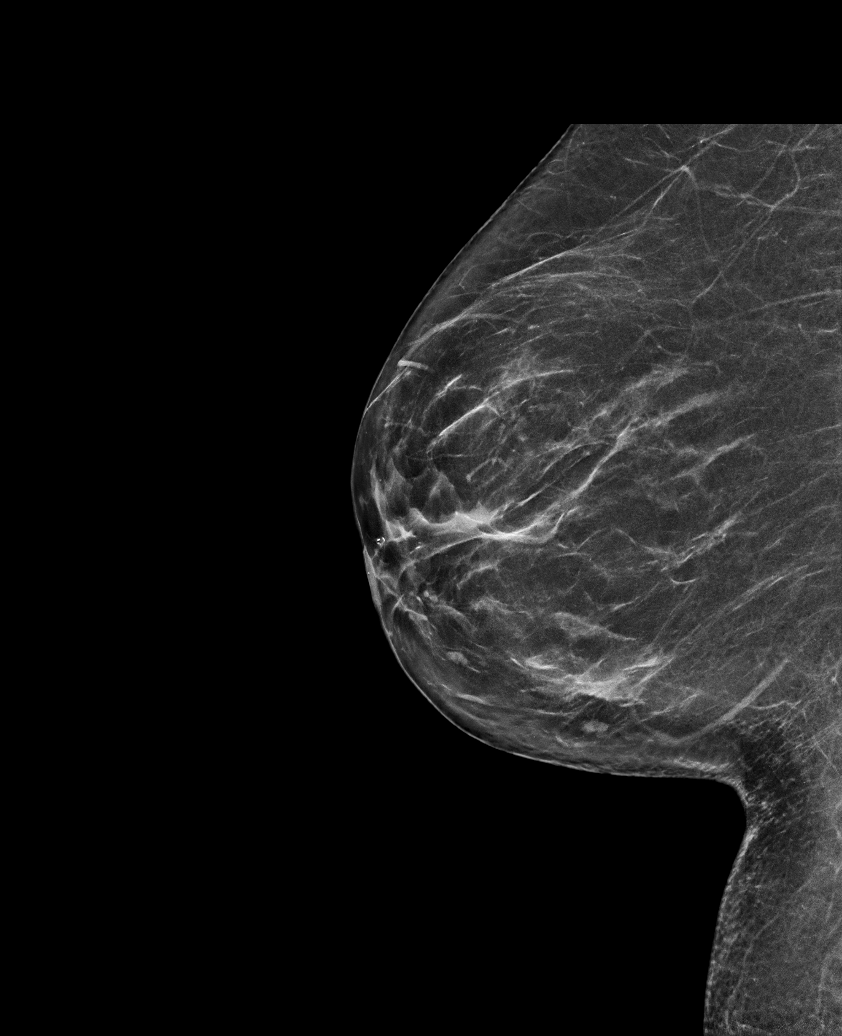

[R CC synth-2D]
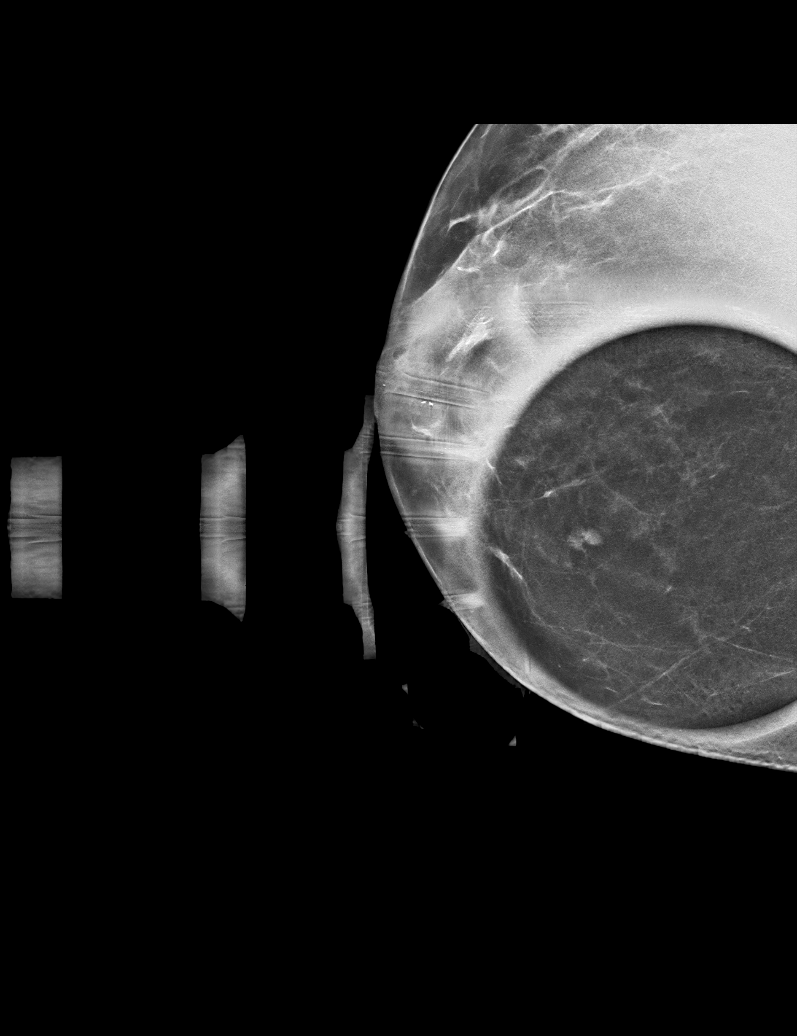

[R MLO synth-2D]
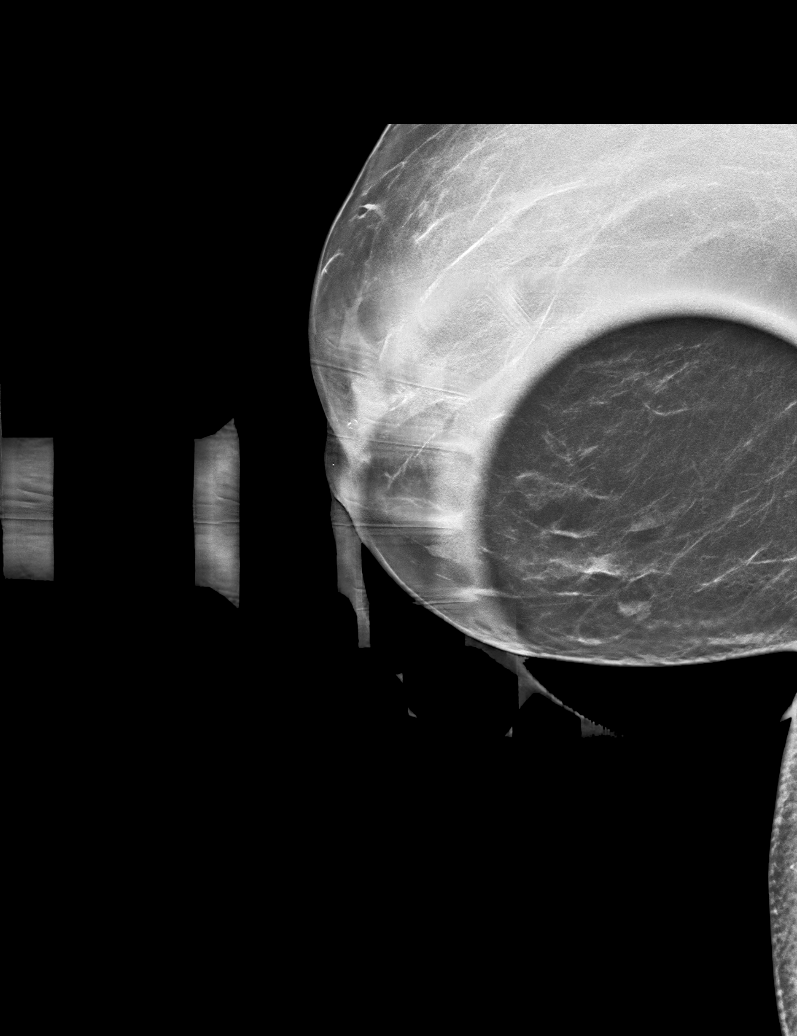

[L CC synth-2D]
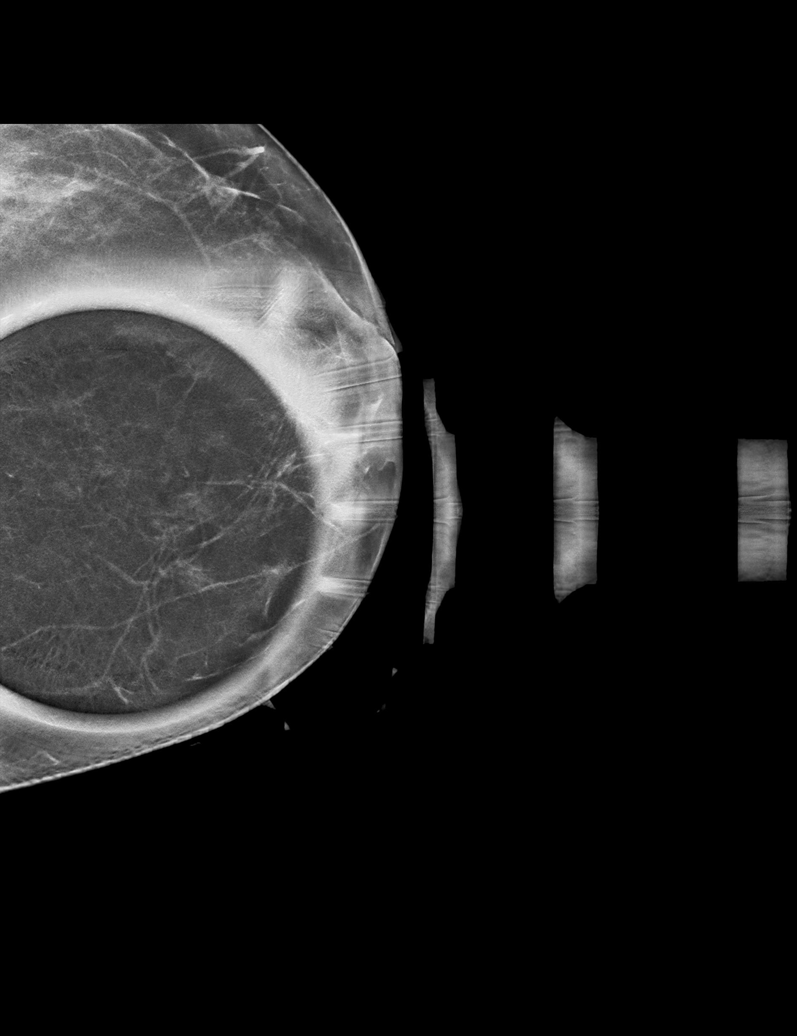

[L MLO synth-2D]
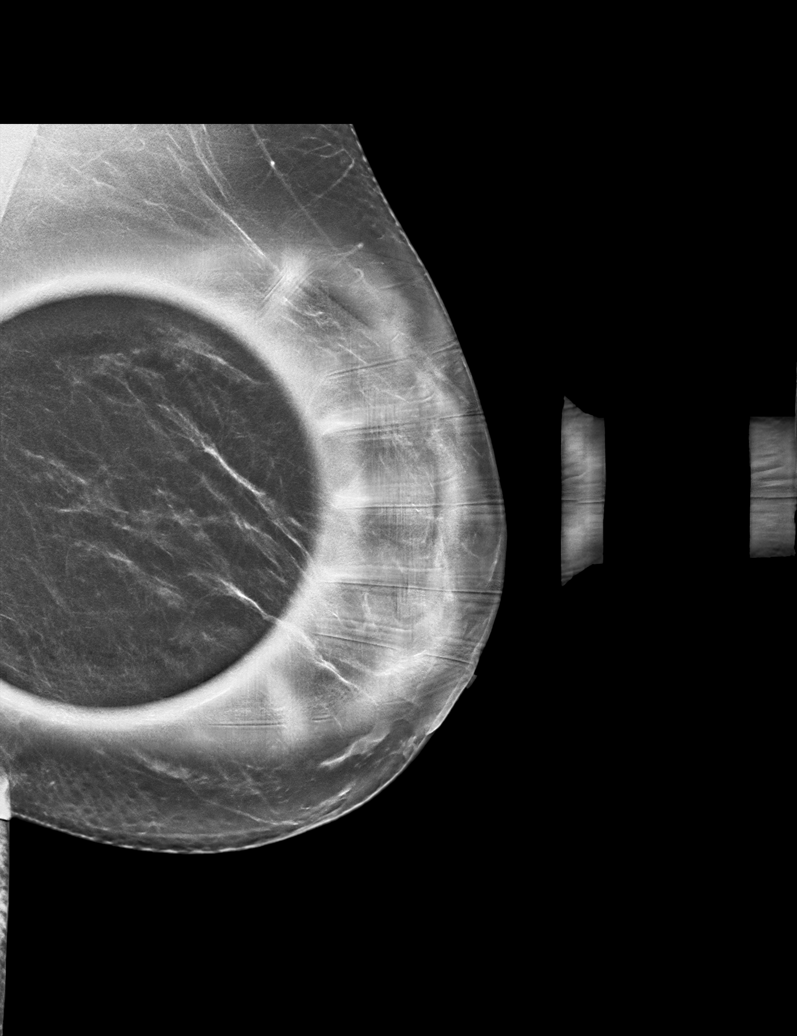

[6 of 36 positions shown; findings below may reference images not displayed]

ACR Breast Density Category b: There are scattered areas of
fibroglandular density.
FINDINGS: Spot compression tomosynthesis images through the lower inner aspect
of the right breast demonstrates a persistent oval lobulated
circumscribed 7 mm mass. In the upper inner quadrant of the left
breast there is a persistent 1.3 cm asymmetry with interspersed
macroscopic fat, suggestive of a island of benign fibroglandular
tissue. The asymmetry in the lateral aspect of the left breast seen
only on the lateral view is less prominent on the spot compression
tomosynthesis images, and no clear correlate is seen on the CC view.

Ultrasound targeted to the right breast at 5 o'clock, 4 cm from the
nipple demonstrates a cluster of 3 anechoic oval circumscribed
masses together measuring 0.6 x 0.3 x 0.6 cm, consistent with benign
fibrocystic changes.

Ultrasound of the right axilla demonstrates multiple
normal-appearing lymph nodes.

Ultrasound of the left breast at 3 o'clock, 5 cm from the nipple
demonstrates another cluster of anechoic cysts together measuring
0.7 x 0.3 x 0.5 cm.

Ultrasound of the medial left breast in the upper inner and lower
inner quadrants demonstrates normal fibroglandular tissue. There are
no abnormal targeted sonographic correlates for the asymmetry seen
mammographically on the CC view. There is a small island of
fibroglandular tissue at 7 o'clock, 3 cm from the nipple which could
correspond with the asymmetry.

Ultrasound of the left axilla demonstrates multiple normal-appearing
lymph nodes.
IMPRESSION: 1. There is a likely benign asymmetry in the medial aspect of the
left breast without a definite sonographic correlate.

2.  Benign fibrocystic changes in the bilateral breasts.

3.  No evidence of bilateral axillary lymphadenopathy.

RECOMMENDATION:
1.  Six-month follow-up diagnostic left breast mammogram.

2. Consider genetics assessment to determine the patient's lifetime
risk of breast cancer given her family history, if this has not
already been performed. Per American Cancer Society guidelines, if
the patient has a calculated lifetime risk of developing breast
cancer of greater than 20%, annual screening MRI of the breasts
would be recommended at the time of screening mammography.

I have discussed the findings and recommendations with the patient.
If applicable, a reminder letter will be sent to the patient
regarding the next appointment.

BI-RADS CATEGORY  3: Probably benign.

## 2022-07-25 ENCOUNTER — Ambulatory Visit (INDEPENDENT_AMBULATORY_CARE_PROVIDER_SITE_OTHER): Payer: BC Managed Care – PPO | Admitting: Internal Medicine

## 2022-07-25 ENCOUNTER — Ambulatory Visit: Payer: BC Managed Care – PPO | Attending: Internal Medicine

## 2022-07-25 ENCOUNTER — Encounter: Payer: Self-pay | Admitting: Internal Medicine

## 2022-07-25 VITALS — BP 126/76 | HR 73 | Temp 98.4°F | Resp 18 | Ht 66.0 in | Wt 256.2 lb

## 2022-07-25 DIAGNOSIS — R002 Palpitations: Secondary | ICD-10-CM

## 2022-07-25 DIAGNOSIS — R079 Chest pain, unspecified: Secondary | ICD-10-CM

## 2022-07-25 LAB — BASIC METABOLIC PANEL
BUN: 10 mg/dL (ref 6–23)
CO2: 23 mEq/L (ref 19–32)
Calcium: 9.4 mg/dL (ref 8.4–10.5)
Chloride: 103 mEq/L (ref 96–112)
Creatinine, Ser: 0.69 mg/dL (ref 0.40–1.20)
GFR: 110.63 mL/min (ref >60.00)
Glucose, Bld: 82 mg/dL (ref 70–99)
Potassium: 4 mEq/L (ref 3.5–5.1)
Sodium: 137 mEq/L (ref 135–145)

## 2022-07-25 LAB — CBC WITH DIFFERENTIAL/PLATELET
Basophils Absolute: 0 10*3/uL (ref 0.0–0.1)
Basophils Relative: 0.3 % (ref 0.0–3.0)
Eosinophils Absolute: 0.1 10*3/uL (ref 0.0–0.7)
Eosinophils Relative: 1 % (ref 0.0–5.0)
HCT: 45.1 % (ref 36.0–46.0)
Hemoglobin: 15.1 g/dL — ABNORMAL HIGH (ref 12.0–15.0)
Lymphocytes Relative: 21.5 % (ref 12.0–46.0)
Lymphs Abs: 2.5 10*3/uL (ref 0.7–4.0)
MCHC: 33.5 g/dL (ref 30.0–36.0)
MCV: 87.9 fl (ref 78.0–100.0)
Monocytes Absolute: 0.9 10*3/uL (ref 0.1–1.0)
Monocytes Relative: 7.3 % (ref 3.0–12.0)
Neutro Abs: 8.2 10*3/uL — ABNORMAL HIGH (ref 1.4–7.7)
Neutrophils Relative %: 69.9 % (ref 43.0–77.0)
Platelets: 315 10*3/uL (ref 150.0–400.0)
RBC: 5.13 Mil/uL — ABNORMAL HIGH (ref 3.87–5.11)
RDW: 13.9 % (ref 11.5–15.5)
WBC: 11.7 10*3/uL — ABNORMAL HIGH (ref 4.0–10.5)

## 2022-07-25 NOTE — Progress Notes (Unsigned)
Enrolled for [employer] to mail a ZIO XT long term holter monitor to the patients address on file.   DOD to read. 

## 2022-07-25 NOTE — Patient Instructions (Addendum)
Will schedule an echocardiogram and a Holter monitor.  They should be contacting you about these tests soon  If you have symptoms again: Go to the emergency room and then let me know    GO TO THE LAB : Get the blood work     [place], [place] back for a checkup in 6 weeks

## 2022-07-25 NOTE — Progress Notes (Unsigned)
   Subjective:    Patient ID: Shari Small, female    DOB: 05-02-1986, 37 y.o.   MRN: [id number]  DOS:  07/25/2022 Type of visit - description: Acute  2 days ago, she was at home cooking and suddenly developed intense painful sensation in the middle of the chest associated with a feeling of "blood rushing out of my heart". She felt palpitations described as a strong heartbeats. She felt sweaty, flushed. She denies LOC but felt slightly lightheaded No recent fever or chills No headaches Symptoms lasted about 30 minutes and then faded away. That night she had a couple of other similar episode but much less intense and very short. Since then, she has experienced some DOE.  On looking back, she had mild episode like this  for the last 1.5 years  She denies any recent airplane trips or prolonged car trip. No cough or sputum production No headaches No stress , anxiety or depression.   Review of Systems See above   Past Medical History:  Diagnosis Date   Heterotopic ossification 02/2013   right thigh    Past Surgical History:  Procedure Laterality Date   INCISION AND DRAINAGE HIP Right 03/19/2013   RIGHT THIGH EXCISION  HETEROTOPIC OSSIFICATIONS;  Surgeon: Ninetta Lights, MD;  Location: [facility];  Service: Orthopedics;  Laterality: Right;   WISDOM TOOTH EXTRACTION  2004    Current Outpatient Medications  Medication Instructions   pantoprazole (PROTONIX) 40 mg, Oral, Daily before breakfast   tiZANidine (ZANAFLEX) 4 mg, Oral, Every 8 hours PRN       Objective:   Physical Exam BP 126/76   Pulse 73   Temp 98.4 F (36.9 C) (Oral)   Resp 18   Ht 5\' 6"  (1.676 m)   Wt 256 lb 4 oz (116.2 kg)   LMP 07/11/2022 (Approximate)   SpO2 97%   BMI 41.36 kg/m  General:   Well developed, NAD, BMI noted. HEENT:  Normocephalic . Face symmetric, atraumatic Lungs:  CTA B Normal respiratory effort, no intercostal retractions, no accessory muscle use. Heart: RRR,  no  murmur.  Lower extremities: no pretibial edema bilaterally.  No edema, calves symmetric and nontender Skin: Not pale. Not jaundice Neurologic:  alert & oriented X3.  Speech normal, gait appropriate for age and unassisted Psych--  Cognition and judgment appear intact.  Cooperative with normal attention span and concentration.  Behavior appropriate. No anxious or depressed appearing.      Assessment    ASSESSMENT    (New patient 07/2019, referred by her sister) Heterotopic ossification (s/p  surgery for removal of calcification around the hip due to a hematoma) Parotid gland stones/parotitis Obesity Family history: -Sister has colon cancer age 39and breast cancer age 32   -GF: colon ca age 70  BC: abstinence as of 11/2018  PLAN  Palpitations: Had a severe episode 2 days ago associated with chest pain, afterwards she has noticed some DOE. This is not the first time this happened. No FH of CAD but + for A-fib.  Last LDL 122.  Not a smoker. She denies headaches, hypertension, stress or anxiety.  Recent TSH normal EKG today: NSR. Etiology unclear.   Plan: - If further symptoms go to the ER - BMP, CBC.  Although my suspicion for PE is low, I will check a D-dimer as a screening - Echo, Holter monitor x 1 week. - Further advise w/ result. RTC 3 months

## 2022-07-26 LAB — D-DIMER, QUANTITATIVE: D-Dimer, Quant: 0.33 mcg/mL FEU (ref <0.50)

## 2022-07-26 NOTE — Assessment & Plan Note (Signed)
Palpitations: Had a severe episode 2 days ago associated with chest pain, afterwards she has noticed some DOE. This is not the first time this happened. No FH of CAD but + for A-fib.  Last LDL 122.  Not a smoker. She denies headaches, hypertension, stress or anxiety.  Recent TSH normal EKG today: NSR. Etiology unclear.   Plan: - If further symptoms go to the ER - BMP, CBC.  Although my suspicion for PE is low, I will check a D-dimer as a screening - Echo, Holter monitor x 1 week. - Further advise w/ result. RTC 3 months

## 2022-07-28 DIAGNOSIS — R079 Chest pain, unspecified: Secondary | ICD-10-CM | POA: Diagnosis not present

## 2022-07-28 DIAGNOSIS — R002 Palpitations: Secondary | ICD-10-CM

## 2022-08-14 DIAGNOSIS — R002 Palpitations: Secondary | ICD-10-CM | POA: Diagnosis not present

## 2022-08-14 DIAGNOSIS — R079 Chest pain, unspecified: Secondary | ICD-10-CM | POA: Diagnosis not present

## 2022-08-15 ENCOUNTER — Ambulatory Visit (HOSPITAL_COMMUNITY)
Admission: RE | Admit: 2022-08-15 | Discharge: 2022-08-15 | Disposition: A | Discharge status: Home / Self Care | Payer: BC Managed Care – PPO | Source: Ambulatory Visit | Attending: Internal Medicine | Admitting: Internal Medicine

## 2022-08-15 DIAGNOSIS — R079 Chest pain, unspecified: Secondary | ICD-10-CM | POA: Diagnosis not present

## 2022-08-15 DIAGNOSIS — R002 Palpitations: Secondary | ICD-10-CM

## 2022-08-15 LAB — ECHOCARDIOGRAM COMPLETE
Area-P 1/2: 4.06 cm2
Calc EF: 64.5 %
S' Lateral: 3.1 cm
Single Plane A2C EF: 64.8 %
Single Plane A4C EF: 65 %

## 2022-08-15 NOTE — Progress Notes (Signed)
Echocardiogram 2D Echocardiogram has been performed.  Shari Small 08/15/2022, 9:54 AM

## 2022-09-05 ENCOUNTER — Encounter: Payer: Self-pay | Admitting: Internal Medicine

## 2022-09-05 ENCOUNTER — Ambulatory Visit (INDEPENDENT_AMBULATORY_CARE_PROVIDER_SITE_OTHER): Payer: BC Managed Care – PPO | Admitting: Internal Medicine

## 2022-09-05 VITALS — BP 126/76 | HR 77 | Temp 98.2°F | Resp 16 | Ht 66.0 in | Wt 259.1 lb

## 2022-09-05 DIAGNOSIS — R002 Palpitations: Secondary | ICD-10-CM

## 2022-09-05 MED ORDER — METOPROLOL TARTRATE 25 MG PO TABS: 12.5000 mg | ORAL_TABLET | Freq: Two times a day (BID) | ORAL | 3 refills | Status: DC

## 2022-09-05 NOTE — Patient Instructions (Addendum)
Start taking metoprolol 25 mg: Half tablet twice daily.  Let me know if you have side effects (for instance low blood pressure, fatigue)  Check the  blood pressure regularly BP GOAL is between 110/65 and  135/85. If it is consistently higher or lower, let me know    GO TO THE FRONT DESK, [place] back for a checkup in 6 months

## 2022-09-05 NOTE — Progress Notes (Unsigned)
   Subjective:    Patient ID: Shari Small, female    DOB: 1985-08-21, 37 y.o.   MRN: [id number]  DOS:  09/05/2022 Type of visit - description: Follow-up  Follow-up on previous visit She was seen with palpitations. Symptoms have increased in frequency in the last 3 weeks. They happen at times so frequently she cannot count them. Description remains the same as before. Today however she denies DOE. 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 No LOC.  Review of Systems See above   Past Medical History:  Diagnosis Date   Heterotopic ossification 02/2013   right thigh    Past Surgical History:  Procedure Laterality Date   INCISION AND DRAINAGE HIP Right 03/19/2013   RIGHT THIGH EXCISION  HETEROTOPIC OSSIFICATIONS;  Surgeon: Ninetta Lights, MD;  Location: [facility];  Service: Orthopedics;  Laterality: Right;   WISDOM TOOTH EXTRACTION  2004    Current Outpatient Medications  Medication Instructions   pantoprazole (PROTONIX) 40 mg, Oral, Daily before breakfast   tiZANidine (ZANAFLEX) 4 mg, Oral, Every 8 hours PRN       Objective:   Physical Exam BP 126/76   Pulse 77   Temp 98.2 F (36.8 C) (Oral)   Resp 16   Ht 5\' 6"  (1.676 m)   Wt 259 lb 2 oz (117.5 kg)   LMP 09/03/2022 (Exact Date)   SpO2 98%   BMI 41.82 kg/m  General:   Well developed, NAD, BMI noted. HEENT:  Normocephalic . Face symmetric, atraumatic Lungs:  CTA B Normal respiratory effort, no intercostal retractions, no accessory muscle use. Heart: RRR,  no murmur.  Lower extremities: no pretibial edema bilaterally  Skin: Not pale. Not jaundice Neurologic:  alert & oriented X3.  Speech normal, gait appropriate for age and unassisted Psych--  Cognition and judgment appear intact.  Cooperative with normal attention span and concentration.  Behavior appropriate. No anxious or depressed appearing.      Assessment      ASSESSMENT    (New patient 07/2019, referred by her sister) Heterotopic ossification (s/p  surgery for removal of calcification around the hip due to a hematoma) Parotid gland stones/parotitis Obesity Family history: -Sister has colon cancer age 51and breast cancer age 46   -GF: colon ca age 70  BC: abstinence as of 11/2018  PLAN  Palpitations,  Follow-up from previous visit.  Blood work was normal except for slightly increased WBCs.  Echo normal.  Holter monitor showed frequent PACs And PVCs. Since then, symptoms has definitely increased in frequency to a point that some days they are too numerous to count. No LOC.  No DOE. Plan:  Trial w/ low dose  metoprolol, watch for low BP sxs Refer to cardiology for further eval. OSA?  BMI 41, admits to mild snoring but does not fall asleep easily.  She might need a sleep study, for now we will ask cardiology to see her for palpitations, they might consider to order a sleep study if not I will reassess the situation in few months. RTC 6 months

## 2022-09-06 NOTE — Assessment & Plan Note (Signed)
Palpitations,  Follow-up from previous visit.  Blood work was normal except for slightly increased WBCs.  Echo normal.  Holter monitor showed frequent PACs And PVCs. Since then, symptoms has definitely increased in frequency to a point that some days they are too numerous to count. No LOC.  No DOE. Plan:  Trial w/ low dose  metoprolol, watch for low BP sxs Refer to cardiology for further eval. OSA?  BMI 41, admits to mild snoring but does not fall asleep easily.  She might need a sleep study, for now we will ask cardiology to see her for palpitations, they might consider to order a sleep study if not I will reassess the situation in few months. RTC 6 months

## 2022-09-21 ENCOUNTER — Telehealth: Payer: Self-pay | Admitting: Internal Medicine

## 2022-09-21 NOTE — Telephone Encounter (Signed)
Spoke w/ Pt- informed of recommendations. Pt verbalized understanding.  

## 2022-09-21 NOTE — Telephone Encounter (Signed)
On Monday, please reach out to cardiology and see if her appointment can be moved up. Increase metoprolol 25 mg to 1 full tablet twice daily ER if severe symptoms.

## 2022-09-21 NOTE — Telephone Encounter (Signed)
Please advise 

## 2022-09-21 NOTE — Telephone Encounter (Signed)
Pt called to see if she can go up on her dose of metoprolol as the condition has worsened and has started impacting her quality of life. Pt cannot get into see cardiologist until 5/3. Please call to advise if she can increase dose or if there are any other recommendations outside of going to the hospital.

## 2022-09-24 NOTE — Telephone Encounter (Signed)
Called [facility]- spoke w/ Leavy Cella- she was able to get Pt in on 4/22 at 1:40pm w/ Dr. Anne Fu, she also added her to wait list if anything sooner becomes available.

## 2022-09-24 NOTE — Telephone Encounter (Signed)
Spoke w/ Pt- made her aware of change in appt. Pt verbalized understanding.

## 2022-09-27 ENCOUNTER — Encounter: Payer: Self-pay | Admitting: Internal Medicine

## 2022-10-01 ENCOUNTER — Ambulatory Visit: Payer: BC Managed Care – PPO | Attending: Interventional Cardiology | Admitting: Interventional Cardiology

## 2022-10-01 ENCOUNTER — Encounter: Payer: Self-pay | Admitting: Interventional Cardiology

## 2022-10-01 VITALS — BP 126/82 | HR 56 | Ht 67.0 in | Wt 259.4 lb

## 2022-10-01 DIAGNOSIS — I493 Ventricular premature depolarization: Secondary | ICD-10-CM

## 2022-10-01 DIAGNOSIS — R002 Palpitations: Secondary | ICD-10-CM | POA: Diagnosis not present

## 2022-10-01 DIAGNOSIS — I491 Atrial premature depolarization: Secondary | ICD-10-CM | POA: Diagnosis not present

## 2022-10-01 NOTE — Patient Instructions (Signed)
Medication Instructions:  Your physician recommends that you continue on your current medications as directed. Please refer to the Current Medication list given to you today.  *If you need a refill on your cardiac medications before your next appointment, please call your pharmacy*   Lab Work: none If you have labs (blood work) drawn today and your tests are completely normal, you will receive your results only by: MyChart Message (if you have MyChart) OR A paper copy in the mail If you have any lab test that is abnormal or we need to change your treatment, we will call you to review the results.   Testing/Procedures: none   Follow-Up: At [facility], you and your health needs are our priority.  As part of our continuing mission to provide you with exceptional heart care, we have created designated Provider Care Teams.  These Care Teams include your primary Cardiologist (physician) and Advanced Practice Providers (APPs -  Physician Assistants and Nurse Practitioners) who all work together to provide you with the care you need, when you need it.  We recommend signing up for the patient portal called "MyChart".  Sign up information is provided on this After Visit Summary.  MyChart is used to connect with patients for Virtual Visits (Telemedicine).  Patients are able to view lab/test results, encounter notes, upcoming appointments, etc.  Non-urgent messages can be sent to your provider as well.   To learn more about what you can do with MyChart, go to [email or web address].    Your next appointment:   As needed  Provider:   Jayadeep Varanasi, MD     Other Instructions    

## 2022-10-01 NOTE — Progress Notes (Signed)
Cardiology Office Note   Date:  10/01/2022   ID:  Saroya, Riccobono 08-Mar-1986, MRN [id number]  PCP:  Wanda Plump, MD    No chief complaint on file.  palpitations  Wt Readings from Last 3 Encounters:  10/01/22 259 lb 6.4 oz (117.7 kg)  09/05/22 259 lb 2 oz (117.5 kg)  07/25/22 256 lb 4 oz (116.2 kg)       History of Present Illness: Shari Small is a 37 y.o. female who is being seen today for the evaluation of palpitations at the request of Wanda Plump, MD.   Sx have been occurring for years.  Monitor in 2024: "Normal sinus rhythm with rare PACs and rare PVCs.   Brief run of PACs.   Patient symptoms associated with brief run of PACs and isolated PVCs.   No atrial fibrillation.  No sustained pathologic arrhythmias"   Metoprolol was started.  A few episodes where there were repetitive skipped beats and there was near syncope.   Minimizes caffeine.  Feels that she sleeps well.   Palpitations currently arm improved.  She is concerned that they may come back.  Symptoms improved after initially starting the lower dose of metoprolol.  She did well for a few days but then symptoms came back.  She is now doing well on the higher dose of metoprolol.   Past Medical History:  Diagnosis Date   Heterotopic ossification 02/2013   right thigh    Past Surgical History:  Procedure Laterality Date   INCISION AND DRAINAGE HIP Right 03/19/2013   RIGHT THIGH EXCISION  HETEROTOPIC OSSIFICATIONS;  Surgeon: Loreta Ave, MD;  Location: [facility];  Service: Orthopedics;  Laterality: Right;   WISDOM TOOTH EXTRACTION  2004     Current Outpatient Medications  Medication Sig Dispense Refill   cetirizine (ZYRTEC) 10 MG tablet Take by mouth.     metoprolol tartrate (LOPRESSOR) 25 MG tablet Take 1 tablet (25 mg total) by mouth 2 (two) times daily.     pantoprazole (PROTONIX) 40 MG tablet Take 1 tablet (40 mg total) by mouth daily before breakfast. 90 tablet 0    tiZANidine (ZANAFLEX) 4 MG tablet Take 1 tablet (4 mg total) by mouth every 8 (eight) hours as needed for muscle spasms. 60 tablet 1   No current facility-administered medications for this visit.    Allergies:   Tussin [guaifenesin]    Social History:  The patient  reports that she quit smoking about 9 years ago. Her smoking use included cigarettes. She has never used smokeless tobacco. She reports that she does not currently use alcohol. She reports that she does not use drugs.   Family History:  The patient's family history includes Breast cancer in her maternal aunt and sister; Colon cancer in her paternal grandfather and sister; Diabetes in her father; Heart disease in her father and paternal grandmother; Melanoma in her maternal grandmother. Father with AFib at 76- died with CHF. Pacemakers in the family.   ROS:  Please see the history of present illness.   Otherwise, review of systems are positive for palpitations.   All other systems are reviewed and negative.    PHYSICAL EXAM: VS:  BP 126/82   Pulse (!) 56   Ht  (1.702 m)   Wt 259 lb 6.4 oz (117.7 kg)   LMP 09/03/2022 (Exact Date)   SpO2 98%   BMI 40.63 kg/m  , BMI Body mass index is 40.63  kg/m. GEN: Well nourished, well developed, in no acute distress HEENT: normal Neck: no JVD, carotid bruits, or masses Cardiac: RRR; no murmurs, rubs, or gallops,no edema  Respiratory:  clear to auscultation bilaterally, normal work of breathing GI: soft, nontender, nondistended, + BS, obese MS: no deformity or atrophy Skin: warm and dry, no rash Neuro:  Strength and sensation are intact Psych: euthymic mood, full affect   EKG:   The ekg ordered today demonstrates normal Sinus rhythm   Recent Labs: 11/16/2021: ALT 8 12/20/2021: TSH 0.72 07/25/2022: BUN 10; Creatinine, Ser 0.69; Hemoglobin 15.1; Platelets 315.0; Potassium 4.0; Sodium 137   Lipid Panel    Component Value Date/Time   CHOL 187 12/20/2021 1337   TRIG 86.0  12/20/2021 1337   HDL 47.70 12/20/2021 1337   CHOLHDL 4 12/20/2021 1337   VLDL 17.2 12/20/2021 1337   LDLCALC 122 (H) 12/20/2021 1337     Other studies Reviewed: Additional studies/ records that were reviewed today with results demonstrating: TSH 0.72 and July 2023.   ASSESSMENT AND PLAN:  PACs/PVCs: sx associated with PACs and PVCs.  Symptoms controlled on metoprolol.  Her description of her symptoms is classic for premature beats.  If symptoms recur on metoprolol, would refer to EP for consideration of either antiarrhythmic drug or ablation.    Morbid obesity: Weight loss will be beneficial long-term.  We did discuss her sleep.  She feels like she sleeps very well.  No issues with fatigue.  She is a candidate for obstructive sleep apnea.  Certainly if she were to develop atrial fibrillation, that would prompt a sleep study.  If she has more symptoms of sleep apnea, would have a low threshold to send her for sleep study.    Current medicines are reviewed at length with the patient today.  The patient concerns regarding her medicines were addressed.  The following changes have been made:  No change  Labs/ tests ordered today include:  No orders of the defined types were placed in this encounter.   Recommend 150 minutes/week of aerobic exercise Low fat, low carb, high fiber diet recommended  Disposition:   FU as needed if premature beats worsen   Signed, Lance Muss, MD  10/01/2022 2:04 PM    [facility] [address] [place], [place], [place]  [address] Phone: [phone]; Fax: [phone]

## 2022-10-08 ENCOUNTER — Ambulatory Visit: Payer: BC Managed Care – PPO | Admitting: Cardiology

## 2022-10-15 ENCOUNTER — Telehealth: Payer: Self-pay | Admitting: Internal Medicine

## 2022-10-15 MED ORDER — METOPROLOL TARTRATE 25 MG PO TABS: 25.0000 mg | ORAL_TABLET | Freq: Two times a day (BID) | ORAL | 1 refills | Status: DC

## 2022-10-15 NOTE — Telephone Encounter (Signed)
Rx sent 

## 2022-10-15 NOTE — Addendum Note (Signed)
Addended byConrad DISH D on: 10/15/2022 09:32 AM   Modules accepted: Orders

## 2022-10-15 NOTE — Telephone Encounter (Signed)
Prescription Request  10/15/2022  Is this a "Controlled Substance" medicine? No  LOV: 09/05/2022  What is the name of the medication or equipment?   metoprolol tartrate (LOPRESSOR) 25 MG tablet [[id number]]   Have you contacted your pharmacy to request a refill? No   Which pharmacy would you like this sent to?  [id number] [place], [place] - [address] [place] [address] Phone: [phone] Fax: [phone]    Patient notified that their request is being sent to the clinical staff for review and that they should receive a response within 2 business days.   Please advise at Mobile [phone] (mobile)

## 2022-10-19 ENCOUNTER — Ambulatory Visit: Payer: BC Managed Care – PPO | Admitting: Cardiovascular Disease

## 2022-10-29 ENCOUNTER — Ambulatory Visit: Payer: BC Managed Care – PPO | Admitting: Cardiology

## 2023-01-31 ENCOUNTER — Other Ambulatory Visit: Payer: Self-pay

## 2023-01-31 ENCOUNTER — Emergency Department (HOSPITAL_BASED_OUTPATIENT_CLINIC_OR_DEPARTMENT_OTHER): Payer: BC Managed Care – PPO

## 2023-01-31 ENCOUNTER — Encounter (HOSPITAL_BASED_OUTPATIENT_CLINIC_OR_DEPARTMENT_OTHER): Payer: Self-pay | Admitting: Emergency Medicine

## 2023-01-31 ENCOUNTER — Emergency Department (HOSPITAL_BASED_OUTPATIENT_CLINIC_OR_DEPARTMENT_OTHER)
Admission: EM | Admit: 2023-01-31 | Discharge: 2023-01-31 | Disposition: A | Discharge status: Home / Self Care | Payer: BC Managed Care – PPO | Attending: Emergency Medicine | Admitting: Emergency Medicine

## 2023-01-31 DIAGNOSIS — M79671 Pain in right foot: Secondary | ICD-10-CM | POA: Diagnosis not present

## 2023-01-31 DIAGNOSIS — M79641 Pain in right hand: Secondary | ICD-10-CM | POA: Diagnosis not present

## 2023-01-31 DIAGNOSIS — R0789 Other chest pain: Secondary | ICD-10-CM | POA: Diagnosis not present

## 2023-01-31 DIAGNOSIS — S6992XA Unspecified injury of left wrist, hand and finger(s), initial encounter: Secondary | ICD-10-CM | POA: Diagnosis not present

## 2023-01-31 DIAGNOSIS — R1032 Left lower quadrant pain: Secondary | ICD-10-CM

## 2023-01-31 DIAGNOSIS — T07XXXA Unspecified multiple injuries, initial encounter: Secondary | ICD-10-CM

## 2023-01-31 DIAGNOSIS — Y92410 Unspecified street and highway as the place of occurrence of the external cause: Secondary | ICD-10-CM | POA: Diagnosis not present

## 2023-01-31 DIAGNOSIS — R079 Chest pain, unspecified: Secondary | ICD-10-CM | POA: Diagnosis not present

## 2023-01-31 DIAGNOSIS — S20211A Contusion of right front wall of thorax, initial encounter: Secondary | ICD-10-CM | POA: Diagnosis not present

## 2023-01-31 DIAGNOSIS — S52502A Unspecified fracture of the lower end of left radius, initial encounter for closed fracture: Secondary | ICD-10-CM

## 2023-01-31 DIAGNOSIS — S60311A Abrasion of right thumb, initial encounter: Secondary | ICD-10-CM | POA: Diagnosis not present

## 2023-01-31 DIAGNOSIS — S301XXA Contusion of abdominal wall, initial encounter: Secondary | ICD-10-CM | POA: Diagnosis not present

## 2023-01-31 DIAGNOSIS — S0990XA Unspecified injury of head, initial encounter: Secondary | ICD-10-CM | POA: Diagnosis not present

## 2023-01-31 DIAGNOSIS — S52592A Other fractures of lower end of left radius, initial encounter for closed fracture: Secondary | ICD-10-CM | POA: Diagnosis not present

## 2023-01-31 DIAGNOSIS — S60811A Abrasion of right wrist, initial encounter: Secondary | ICD-10-CM | POA: Insufficient documentation

## 2023-01-31 DIAGNOSIS — S52572A Other intraarticular fracture of lower end of left radius, initial encounter for closed fracture: Secondary | ICD-10-CM | POA: Diagnosis not present

## 2023-01-31 DIAGNOSIS — S199XXA Unspecified injury of neck, initial encounter: Secondary | ICD-10-CM | POA: Diagnosis not present

## 2023-01-31 DIAGNOSIS — M25531 Pain in right wrist: Secondary | ICD-10-CM | POA: Diagnosis not present

## 2023-01-31 DIAGNOSIS — S3011XA Contusion of abdominal wall, initial encounter: Secondary | ICD-10-CM

## 2023-01-31 DIAGNOSIS — M25552 Pain in left hip: Secondary | ICD-10-CM | POA: Diagnosis not present

## 2023-01-31 DIAGNOSIS — S20213A Contusion of bilateral front wall of thorax, initial encounter: Secondary | ICD-10-CM | POA: Diagnosis not present

## 2023-01-31 DIAGNOSIS — S2020XA Contusion of thorax, unspecified, initial encounter: Secondary | ICD-10-CM | POA: Diagnosis not present

## 2023-01-31 LAB — COMPREHENSIVE METABOLIC PANEL
ALT: 15 U/L (ref 0–44)
AST: 24 U/L (ref 15–41)
Albumin: 4.3 g/dL (ref 3.5–5.0)
Alkaline Phosphatase: 66 U/L (ref 38–126)
Anion gap: 14 (ref 5–15)
BUN: 15 mg/dL (ref 6–20)
CO2: 21 mmol/L — ABNORMAL LOW (ref 22–32)
Calcium: 8.9 mg/dL (ref 8.9–10.3)
Chloride: 96 mmol/L — ABNORMAL LOW (ref 98–111)
Creatinine, Ser: 0.96 mg/dL (ref 0.44–1.00)
GFR, Estimated: >60 mL/min (ref >60)
Glucose, Bld: 134 mg/dL — ABNORMAL HIGH (ref 70–99)
Potassium: 3.2 mmol/L — ABNORMAL LOW (ref 3.5–5.1)
Sodium: 131 mmol/L — ABNORMAL LOW (ref 135–145)
Total Bilirubin: 0.6 mg/dL (ref 0.3–1.2)
Total Protein: 7.8 g/dL (ref 6.5–8.1)

## 2023-01-31 LAB — CBC
HCT: 45.5 % (ref 36.0–46.0)
Hemoglobin: 15.2 g/dL — ABNORMAL HIGH (ref 12.0–15.0)
MCH: 29 pg (ref 26.0–34.0)
MCHC: 33.4 g/dL (ref 30.0–36.0)
MCV: 86.7 fL (ref 80.0–100.0)
Platelets: 390 10*3/uL (ref 150–400)
RBC: 5.25 MIL/uL — ABNORMAL HIGH (ref 3.87–5.11)
RDW: 13.9 % (ref 11.5–15.5)
WBC: 31.8 10*3/uL — ABNORMAL HIGH (ref 4.0–10.5)
nRBC: 0 % (ref 0.0–0.2)

## 2023-01-31 LAB — ETHANOL: Alcohol, Ethyl (B): <10 mg/dL (ref <10)

## 2023-01-31 LAB — URINALYSIS, ROUTINE W REFLEX MICROSCOPIC
Bilirubin Urine: NEGATIVE
Glucose, UA: NEGATIVE mg/dL
Ketones, ur: NEGATIVE mg/dL
Leukocytes,Ua: NEGATIVE
Nitrite: NEGATIVE
Protein, ur: 100 mg/dL — AB
Specific Gravity, Urine: >=1.03 (ref 1.005–1.030)
pH: 5.5 (ref 5.0–8.0)

## 2023-01-31 LAB — URINALYSIS, MICROSCOPIC (REFLEX): WBC, UA: NONE SEEN WBC/hpf (ref 0–5)

## 2023-01-31 LAB — PREGNANCY, URINE: Preg Test, Ur: NEGATIVE

## 2023-01-31 LAB — LACTIC ACID, PLASMA: Lactic Acid, Venous: 1.6 mmol/L (ref 0.5–1.9)

## 2023-01-31 LAB — PROTIME-INR
INR: 1 (ref 0.8–1.2)
Prothrombin Time: 13 seconds (ref 11.4–15.2)

## 2023-01-31 MED ORDER — SODIUM CHLORIDE 0.9 % IV SOLN: INTRAVENOUS | Status: DC

## 2023-01-31 MED ORDER — IOHEXOL 300 MG/ML  SOLN
125.0000 mL | Freq: Once | INTRAMUSCULAR | Status: AC | PRN
  Administered 2023-01-31: 125 mL via INTRAVENOUS

## 2023-01-31 NOTE — Discharge Instructions (Signed)
Call orthopedics for follow-up.  CT head neck chest abdomen pelvis without any internal injuries.  There is left lower quadrant abdominal wall contusion with hematoma.  There is also a right upper chest wall contusion.  But nothing internally.  There is a nondisplaced fracture of your distal radius of the left arm.  All other x-rays were negative.  Keep the splint in place and dry and elevate the arm is much as possible.  Use the sling as needed.  Work note provided.  Extra strength Tylenol as needed for pain.

## 2023-01-31 NOTE — ED Notes (Signed)
X-Ray in Pt. room

## 2023-01-31 NOTE — ED Notes (Signed)
Patient transported to CT 
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
## 2023-02-01 ENCOUNTER — Telehealth: Payer: Self-pay | Admitting: Internal Medicine

## 2023-02-01 MED ORDER — IBUPROFEN 800 MG PO TABS: 800.0000 mg | ORAL_TABLET | Freq: Three times a day (TID) | ORAL | 0 refills | Status: DC | PRN

## 2023-02-01 NOTE — Telephone Encounter (Signed)
Please advise 

## 2023-02-01 NOTE — Telephone Encounter (Addendum)
I recommended as first-line combined Tylenol and ibuprofen.  See instructions below.  If that is not controlling the pain, I am willing to send a prescription for hydrocodone. Please make the patient aware of potential for addiction, abuse and constipation. (PDMP reviewed)  How to take:  Tylenol  500 mg OTC 2 tabs a day every 8 hours as needed for pain IBUPROFEN (Advil or Motrin) 200 mg 2 tablets every 12 hours as needed for pain.  Always take it with food because may cause gastritis and ulcers.  If you notice nausea, stomach pain, change in the color of stools --->  Stop the medicine and let us know  Also, recommend ER follow-up office visit recommended
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
## 2023-02-01 NOTE — Telephone Encounter (Signed)
Spoke w/ Pt- informed of recommendations. She wanted to know instead of hydrocodone if ibuprofen 600 or 800mg  could be sent to [facility] on [address]? She will call back to schedule ED f/u

## 2023-02-01 NOTE — Addendum Note (Signed)
Addended by: Willow Ora E on: 02/01/2023 10:52 AM   Modules accepted: Orders

## 2023-02-04 ENCOUNTER — Encounter: Payer: Self-pay | Admitting: Internal Medicine

## 2023-02-04 ENCOUNTER — Ambulatory Visit (INDEPENDENT_AMBULATORY_CARE_PROVIDER_SITE_OTHER): Payer: BC Managed Care – PPO | Admitting: Internal Medicine

## 2023-02-04 DIAGNOSIS — S301XXD Contusion of abdominal wall, subsequent encounter: Secondary | ICD-10-CM | POA: Diagnosis not present

## 2023-02-04 DIAGNOSIS — E876 Hypokalemia: Secondary | ICD-10-CM | POA: Diagnosis not present

## 2023-02-04 DIAGNOSIS — S52502A Unspecified fracture of the lower end of left radius, initial encounter for closed fracture: Secondary | ICD-10-CM | POA: Insufficient documentation

## 2023-02-04 DIAGNOSIS — S2020XD Contusion of thorax, unspecified, subsequent encounter: Secondary | ICD-10-CM | POA: Diagnosis not present

## 2023-02-04 DIAGNOSIS — D72829 Elevated white blood cell count, unspecified: Secondary | ICD-10-CM

## 2023-02-04 NOTE — Patient Instructions (Signed)
Continue ibuprofen and Tylenol as you are doing  Call me if the episode of "near fainting" are not improving  Stop ibuprofen if you develop stomach pain, change in the color of the stools.   GO TO THE LAB : Get the blood work    Come back if not gradually better in the next 3 to 4 weeks.
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
## 2023-02-05 LAB — CBC WITH DIFFERENTIAL/PLATELET
Basophils Absolute: 0 10*3/uL (ref 0.0–0.1)
Basophils Relative: 0.4 % (ref 0.0–3.0)
Eosinophils Absolute: 0.2 10*3/uL (ref 0.0–0.7)
Eosinophils Relative: 1.6 % (ref 0.0–5.0)
HCT: 41.7 % (ref 36.0–46.0)
Hemoglobin: 13.5 g/dL (ref 12.0–15.0)
Lymphocytes Relative: 16.8 % (ref 12.0–46.0)
Lymphs Abs: 2.3 10*3/uL (ref 0.7–4.0)
MCHC: 32.3 g/dL (ref 30.0–36.0)
MCV: 88.5 fl (ref 78.0–100.0)
Monocytes Absolute: 0.8 10*3/uL (ref 0.1–1.0)
Monocytes Relative: 5.7 % (ref 3.0–12.0)
Neutro Abs: 10.1 10*3/uL — ABNORMAL HIGH (ref 1.4–7.7)
Neutrophils Relative %: 75.5 % (ref 43.0–77.0)
Platelets: 323 10*3/uL (ref 150.0–400.0)
RBC: 4.72 Mil/uL (ref 3.87–5.11)
RDW: 14.7 % (ref 11.5–15.5)
WBC: 13.4 10*3/uL — ABNORMAL HIGH (ref 4.0–10.5)

## 2023-02-05 LAB — BASIC METABOLIC PANEL
BUN: 9 mg/dL (ref 6–23)
CO2: 23 meq/L (ref 19–32)
Calcium: 8.9 mg/dL (ref 8.4–10.5)
Chloride: 106 meq/L (ref 96–112)
Creatinine, Ser: 0.65 mg/dL (ref 0.40–1.20)
GFR: 111.81 mL/min (ref >60.00)
Glucose, Bld: 102 mg/dL — ABNORMAL HIGH (ref 70–99)
Potassium: 4.1 meq/L (ref 3.5–5.1)
Sodium: 138 meq/L (ref 135–145)
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
## 2023-02-13 NOTE — Addendum Note (Signed)
Addended by: Willow Ora E on: 02/13/2023 03:30 PM   Modules accepted: Level of Service

## 2023-02-20 ENCOUNTER — Ambulatory Visit (INDEPENDENT_AMBULATORY_CARE_PROVIDER_SITE_OTHER): Payer: BC Managed Care – PPO | Admitting: Internal Medicine

## 2023-02-20 ENCOUNTER — Encounter: Payer: Self-pay | Admitting: Internal Medicine

## 2023-02-20 DIAGNOSIS — N6489 Other specified disorders of breast: Secondary | ICD-10-CM

## 2023-02-20 DIAGNOSIS — S52502A Unspecified fracture of the lower end of left radius, initial encounter for closed fracture: Secondary | ICD-10-CM | POA: Diagnosis not present
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
## 2023-03-20 DIAGNOSIS — S52502A Unspecified fracture of the lower end of left radius, initial encounter for closed fracture: Secondary | ICD-10-CM | POA: Diagnosis not present

## 2023-04-03 ENCOUNTER — Telehealth: Payer: Self-pay | Admitting: Internal Medicine

## 2023-04-03 MED ORDER — METOPROLOL TARTRATE 25 MG PO TABS: 25.0000 mg | ORAL_TABLET | Freq: Two times a day (BID) | ORAL | 1 refills | Status: DC

## 2023-04-03 NOTE — Telephone Encounter (Signed)
Rx has been sent. Pt is overdue for her cpx- please schedule at her convenience. Thank you.

## 2023-04-03 NOTE — Telephone Encounter (Signed)
Prescription Request  04/03/2023  Is this a "Controlled Substance" medicine? No  LOV: 02/20/2023  What is the name of the medication or equipment?   metoprolol tartrate (LOPRESSOR) 25 MG tablet [[id number]]  Have you contacted your pharmacy to request a refill? No   Which pharmacy would you like this sent to?  [id number] [address] [address], [place] - [address] [place] [address] Phone: [phone] Fax: [phone]    Patient notified that their request is being sent to the clinical staff for review and that they should receive a response within 2 business days.   Please advise at Mobile [phone] (mobile)

## 2023-04-03 NOTE — Addendum Note (Signed)
Addended by: Conrad Radar Base D on: 04/03/2023 08:30 AM   Modules accepted: Orders

## 2023-04-24 ENCOUNTER — Encounter: Payer: Self-pay | Admitting: Internal Medicine

## 2023-06-05 ENCOUNTER — Ambulatory Visit
Admission: RE | Admit: 2023-06-05 | Discharge: 2023-06-05 | Disposition: A | Discharge status: Home / Self Care | Payer: BC Managed Care – PPO | Source: Ambulatory Visit | Attending: Obstetrics and Gynecology | Admitting: Obstetrics and Gynecology

## 2023-06-05 DIAGNOSIS — R928 Other abnormal and inconclusive findings on diagnostic imaging of breast: Secondary | ICD-10-CM | POA: Diagnosis not present

## 2023-06-05 DIAGNOSIS — N6489 Other specified disorders of breast: Secondary | ICD-10-CM

## 2023-06-05 DIAGNOSIS — Z124 Encounter for screening for malignant neoplasm of cervix: Secondary | ICD-10-CM | POA: Diagnosis not present

## 2023-06-05 DIAGNOSIS — Z01419 Encounter for gynecological examination (general) (routine) without abnormal findings: Secondary | ICD-10-CM | POA: Diagnosis not present

## 2023-06-05 DIAGNOSIS — Z6841 Body Mass Index (BMI) 40.0 and over, adult: Secondary | ICD-10-CM | POA: Diagnosis not present

## 2023-07-08 ENCOUNTER — Encounter: Payer: BC Managed Care – PPO | Admitting: Internal Medicine

## 2023-10-03 ENCOUNTER — Other Ambulatory Visit: Payer: Self-pay | Admitting: Internal Medicine

## 2023-10-08 ENCOUNTER — Encounter: Admitting: Internal Medicine

## 2023-12-17 ENCOUNTER — Ambulatory Visit (INDEPENDENT_AMBULATORY_CARE_PROVIDER_SITE_OTHER): Admitting: Internal Medicine

## 2023-12-17 ENCOUNTER — Encounter: Payer: Self-pay | Admitting: Internal Medicine

## 2023-12-17 VITALS — BP 126/80 | HR 68 | Temp 98.3°F | Resp 16 | Ht 67.0 in | Wt 281.2 lb

## 2023-12-17 DIAGNOSIS — Z0000 Encounter for general adult medical examination without abnormal findings: Secondary | ICD-10-CM

## 2023-12-17 LAB — CBC WITH DIFFERENTIAL/PLATELET
Basophils Absolute: 0.1 10*3/uL (ref 0.0–0.1)
Basophils Relative: 0.7 % (ref 0.0–3.0)
Eosinophils Absolute: 0.3 10*3/uL (ref 0.0–0.7)
Eosinophils Relative: 2.7 % (ref 0.0–5.0)
HCT: 43 % (ref 36.0–46.0)
Hemoglobin: 14.2 g/dL (ref 12.0–15.0)
Lymphocytes Relative: 27.3 % (ref 12.0–46.0)
Lymphs Abs: 2.6 10*3/uL (ref 0.7–4.0)
MCHC: 33 g/dL (ref 30.0–36.0)
MCV: 86.9 fl (ref 78.0–100.0)
Monocytes Absolute: 0.7 10*3/uL (ref 0.1–1.0)
Monocytes Relative: 7.3 % (ref 3.0–12.0)
Neutro Abs: 5.8 10*3/uL (ref 1.4–7.7)
Neutrophils Relative %: 62 % (ref 43.0–77.0)
Platelets: 288 10*3/uL (ref 150.0–400.0)
RBC: 4.95 Mil/uL (ref 3.87–5.11)
RDW: 14.3 % (ref 11.5–15.5)
WBC: 9.4 10*3/uL (ref 4.0–10.5)

## 2023-12-17 LAB — COMPREHENSIVE METABOLIC PANEL WITH GFR
ALT: 10 U/L (ref 0–35)
AST: 16 U/L (ref 0–37)
Albumin: 4.2 g/dL (ref 3.5–5.2)
Alkaline Phosphatase: 66 U/L (ref 39–117)
BUN: 8 mg/dL (ref 6–23)
CO2: 28 meq/L (ref 19–32)
Calcium: 9.2 mg/dL (ref 8.4–10.5)
Chloride: 103 meq/L (ref 96–112)
Creatinine, Ser: 0.8 mg/dL (ref 0.40–1.20)
GFR: 93 mL/min (ref >60.00)
Glucose, Bld: 84 mg/dL (ref 70–99)
Potassium: 4.5 meq/L (ref 3.5–5.1)
Sodium: 137 meq/L (ref 135–145)
Total Bilirubin: 0.6 mg/dL (ref 0.2–1.2)
Total Protein: 6.6 g/dL (ref 6.0–8.3)

## 2023-12-17 LAB — LIPID PANEL
Cholesterol: 205 mg/dL — ABNORMAL HIGH (ref 0–200)
HDL: 40.2 mg/dL (ref >39.00)
LDL Cholesterol: 146 mg/dL — ABNORMAL HIGH (ref 0–99)
NonHDL: 164.97
Total CHOL/HDL Ratio: 5
Triglycerides: 94 mg/dL (ref 0.0–149.0)
VLDL: 18.8 mg/dL (ref 0.0–40.0)

## 2023-12-17 LAB — HEMOGLOBIN A1C: Hgb A1c MFr Bld: 5.4 % (ref 4.6–6.5)

## 2023-12-17 NOTE — Patient Instructions (Signed)
   GO TO THE LAB :  Get the blood work   Your results will be posted on MyChart with my comments  Next office visit for a physical exam in 1 year. Please make an appointment before you leave today

## 2023-12-17 NOTE — Assessment & Plan Note (Signed)
 Here for CPX - Td 2019 - completed HPV - had a rabies vax booster (worked part-time at [employer]) - rec consider a covid booster and a flu shot q fall - +FH Colon cancer, sister, C-scope 03/2022, next per GI. - Female care, PAP 10/2021 and  MMG 05/2023 ([facility]) - Labs: CMP FLP CBC A1c - Diet discussed, recommend to avoid excessive carbohydrates, + portion control.  She remains active, take frequent hikes.

## 2023-12-17 NOTE — Progress Notes (Signed)
 Subjective:    Patient ID: Shari Small Niece, female    DOB: 1985/07/14, 38 y.o.   MRN: [id number]  DOS:  12/17/2023 Type of visit - description: CPX  Here for CPX. Doing well, has no concerns  Review of Systems     A 14 point review of systems is negative    Past Medical History:  Diagnosis Date   Heterotopic ossification 02/2013   right thigh    Past Surgical History:  Procedure Laterality Date   INCISION AND DRAINAGE HIP Right 03/19/2013   RIGHT THIGH EXCISION  HETEROTOPIC OSSIFICATIONS;  Surgeon: Toribio JULIANNA Chancy, MD;  Location: [facility];  Service: Orthopedics;  Laterality: Right;   WISDOM TOOTH EXTRACTION  2004   Social History   Socioeconomic History   Marital status: Single    Spouse name: Not on file   Number of children: 0   Years of education: Not on file   Highest education level: Master's degree (e.g., MA, MS, MEng, MEd, MSW, MBA)  Occupational History   Occupation: part time     Employer: [employer]    Comment: to stop 2025  Tobacco Use   Smoking status: Former    Current packs/day: 0.00    Types: Cigarettes    Start date: 03/03/2005    Quit date: 03/03/2013    Years since quitting: 10.7   Smokeless tobacco: Never  Vaping Use   Vaping status: Never Used  Substance and Sexual Activity   Alcohol use: Not Currently   Drug use: No   Sexual activity: Not Currently  Other Topics Concern   Not on file  Social History Narrative   Regular exercise: 3-4 x a week; treadmill and pilates   Caffeine use: occasionally   Social Drivers of Health   Financial Resource Strain: Low Risk  (12/16/2023)   Overall Financial Resource Strain (CARDIA)    Difficulty of Paying Living Expenses: Not hard at all  Food Insecurity: No Food Insecurity (12/16/2023)   Hunger Vital Sign    Worried About Running Out of Food in the Last Year: Never true    Ran Out of Food in the Last Year: Never true  Transportation Needs: No Transportation  Needs (12/16/2023)   PRAPARE - Administrator, Civil Service (Medical): No    Lack of Transportation (Non-Medical): No  Physical Activity: Sufficiently Active (12/16/2023)   Exercise Vital Sign    Days of Exercise per Week: 3 days    Minutes of Exercise per Session: 60 min  Stress: No Stress Concern Present (12/16/2023)   [employer] of Occupational Health - Occupational Stress Questionnaire    Feeling of Stress: Not at all  Social Connections: Moderately Isolated (12/16/2023)   Social Connection and Isolation Panel    Frequency of Communication with Friends and Family: More than three times a week    Frequency of Social Gatherings with Friends and Family: Three times a week    Attends Religious Services: Never    Active Member of Clubs or Organizations: Yes    Attends Banker Meetings: More than 4 times per year    Marital Status: Never married  Intimate Partner Violence: Unknown (09/18/2021)   Received from [facility]   HITS    Physically Hurt: Not on file    Insult or Talk Down To: Not on file    Threaten Physical Harm: Not on file    Scream or Curse: Not on file  Current Outpatient Medications  Medication Instructions   cetirizine (ZYRTEC) 10 MG tablet Take by mouth.   metoprolol  tartrate (LOPRESSOR ) 25 mg, Oral, 2 times daily   pantoprazole  (PROTONIX ) 40 mg, Oral, Daily before breakfast   tiZANidine  (ZANAFLEX ) 4 mg, Oral, Every 8 hours PRN       Objective:   Physical Exam BP 126/80   Pulse 68   Temp 98.3 F (36.8 C) (Oral)   Resp 16   Ht 5' 7 (1.702 m)   Wt 281 lb 4 oz (127.6 kg)   LMP 12/03/2023 (Approximate)   SpO2 98%   BMI 44.05 kg/m  General: Well developed, NAD, BMI noted Neck: No  thyromegaly  HEENT:  Normocephalic . Face symmetric, atraumatic Lungs:  CTA B Normal respiratory effort, no intercostal retractions, no accessory muscle use. Heart: RRR,  no murmur.  Abdomen:  Not distended, soft, non-tender. No rebound  or rigidity.   Lower extremities: no pretibial edema bilaterally  Skin: Exposed areas without rash. Not pale. Not jaundice Neurologic:  alert & oriented X3.  Speech normal, gait appropriate for age and unassisted Strength symmetric and appropriate for age.  Psych: Cognition and judgment appear intact.  Cooperative with normal attention span and concentration.  Behavior appropriate. No anxious or depressed appearing.     Assessment    ASSESSMENT    (New patient 07/2019, referred by her sister) Heterotopic ossification (s/p  surgery for removal of calcification around the hip due to a hematoma) Parotid gland stones/parotitis Obesity Family history: -Sister has colon cancer age 60and breast cancer age 60   -GF: colon ca age 6  Palpitations- 07/2022: Holter PAC, PVCs.  Echo WNL Not on [facility]   PLAN Here for CPX - Td 2019 - completed HPV - had a rabies vax booster (worked part-time at [employer]) - rec consider a covid booster and a flu shot q fall - +FH Colon cancer, sister, C-scope 03/2022, next per GI. - Female care, PAP 10/2021 and  MMG 05/2023 ([facility]) - Labs: CMP FLP CBC A1c - Diet discussed, recommend to avoid excessive carbohydrates, + portion control.  She remains active, take frequent hikes. 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

## 2023-12-17 NOTE — Assessment & Plan Note (Signed)
 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

## 2023-12-18 ENCOUNTER — Ambulatory Visit: Payer: Self-pay | Admitting: Internal Medicine

## 2023-12-26 DIAGNOSIS — Z111 Encounter for screening for respiratory tuberculosis: Secondary | ICD-10-CM | POA: Diagnosis not present

## 2023-12-29 DIAGNOSIS — Z111 Encounter for screening for respiratory tuberculosis: Secondary | ICD-10-CM | POA: Diagnosis not present

## 2024-03-13 ENCOUNTER — Other Ambulatory Visit: Payer: Self-pay | Admitting: Internal Medicine
